# Patient Record
Sex: Male | Born: 1955
Health system: Southern US, Community
[De-identification: ages and names within clinical notes are randomized; demographics above are authoritative.]

## PROBLEM LIST (undated history)

## (undated) DIAGNOSIS — R351 Nocturia: Secondary | ICD-10-CM

## (undated) DIAGNOSIS — I10 Essential (primary) hypertension: Secondary | ICD-10-CM

## (undated) DIAGNOSIS — J45909 Unspecified asthma, uncomplicated: Secondary | ICD-10-CM

## (undated) DIAGNOSIS — N529 Male erectile dysfunction, unspecified: Secondary | ICD-10-CM

## (undated) DIAGNOSIS — E119 Type 2 diabetes mellitus without complications: Secondary | ICD-10-CM

## (undated) DIAGNOSIS — C61 Malignant neoplasm of prostate: Secondary | ICD-10-CM

## (undated) DIAGNOSIS — J302 Other seasonal allergic rhinitis: Secondary | ICD-10-CM

## (undated) HISTORY — PX: NO PAST SURGERIES: SHX2092

## (undated) HISTORY — PX: PROSTATE BIOPSY: SHX241

---

## 2009-07-20 ENCOUNTER — Emergency Department: Payer: Self-pay | Admitting: Emergency Medicine

## 2009-09-21 ENCOUNTER — Ambulatory Visit: Payer: Self-pay | Admitting: Otolaryngology

## 2018-03-01 ENCOUNTER — Other Ambulatory Visit: Payer: Self-pay

## 2018-03-01 ENCOUNTER — Emergency Department
Admission: EM | Admit: 2018-03-01 | Discharge: 2018-03-01 | Discharge status: Against Medical Advice | Payer: Self-pay | Attending: Emergency Medicine | Admitting: Emergency Medicine

## 2018-03-01 ENCOUNTER — Encounter: Payer: Self-pay | Admitting: Emergency Medicine

## 2018-03-01 DIAGNOSIS — Z5321 Procedure and treatment not carried out due to patient leaving prior to being seen by health care provider: Secondary | ICD-10-CM | POA: Insufficient documentation

## 2018-03-01 DIAGNOSIS — K0889 Other specified disorders of teeth and supporting structures: Secondary | ICD-10-CM | POA: Insufficient documentation

## 2018-03-01 HISTORY — DX: Type 2 diabetes mellitus without complications: E11.9

## 2018-03-01 HISTORY — DX: Unspecified asthma, uncomplicated: J45.909

## 2018-03-01 NOTE — ED Triage Notes (Signed)
Pt c/o L sided dental pain that radiates up L side of his face. Pt states pain started at 0400 this morning.

## 2018-12-20 ENCOUNTER — Encounter: Payer: Self-pay | Admitting: *Deleted

## 2018-12-20 NOTE — Progress Notes (Signed)
GU Location of Tumor / Histology: prostatic adenocarcinoma  If Prostate Cancer, Gleason Score is (3 + 4) and PSA is (4.77). Prostate volume: 35    Biopsies of prostate (if applicable) revealed:   Past/Anticipated interventions by urology, if any: prostate biospy, referral for consideration of radioactive seeds vs surgery  Past/Anticipated interventions by medical oncology, if any: no  Weight changes, if any: denies  Bowel/Bladder complaints, if any: IPSS 4. SHIM 21 with siladenafil 50. Reports rare dysuria. Denies hematuria. Reports urinary urgency with rare leakage. Denies any bowel complaints.   Nausea/Vomiting, if any: denies  Pain issues, if any:  no  SAFETY ISSUES:  Prior radiation? no  Pacemaker/ICD? no  Possible current pregnancy? no, male patient  Is the patient on methotrexate? no  Current Complaints / other details:  63 year old male. Married with 1 daughter and 3 sons. Stopped smoking 09/1998 after 12 years of smoking 1/2 ppd. Works as a Clinical cytogeneticist for [employer].

## 2018-12-21 ENCOUNTER — Other Ambulatory Visit: Payer: Self-pay

## 2018-12-21 ENCOUNTER — Encounter: Payer: Self-pay | Admitting: Radiation Oncology

## 2018-12-21 ENCOUNTER — Ambulatory Visit
Admission: RE | Admit: 2018-12-21 | Discharge: 2018-12-21 | Disposition: A | Discharge status: Home / Self Care | Payer: 59 | Source: Ambulatory Visit | Attending: Radiation Oncology | Admitting: Radiation Oncology

## 2018-12-21 VITALS — Ht 75.0 in | Wt 250.0 lb

## 2018-12-21 DIAGNOSIS — C61 Malignant neoplasm of prostate: Secondary | ICD-10-CM

## 2018-12-21 HISTORY — DX: Malignant neoplasm of prostate: C61

## 2018-12-21 NOTE — Progress Notes (Signed)
See progress note under physician encounter. 

## 2018-12-21 NOTE — Progress Notes (Signed)
Radiation Oncology         [phone] ________________________________  Initial outpatient Consultation - Conducted via Telephone due to current COVID-19 concerns for limiting patient exposure  Name: Jesse Cardenas MRN: [id number]  Date: 12/21/2018  DOB: 1955-11-26  CC:No primary care provider on file.  Alexis Frock, MD   REFERRING PHYSICIAN: Alexis Frock, MD  DIAGNOSIS: 63 y.o. gentleman with Stage T1c adenocarcinoma of the prostate with Gleason score of 3+4, and PSA of 4.77.    ICD-10-CM   1. Malignant neoplasm of prostate ([place])  C61     HISTORY OF PRESENT ILLNESS: Jesse Cardenas is a 63 y.o. male with a diagnosis of prostate cancer. He was noted to have a persistent elevated PSA of 4.77 in 02/2018, by his primary care physician, Dr. Brunetta Genera, as well as complaints of some bothersome LUTS and increasing erectile dysfunction.  His prior PSA was 4.8 in 09/2017.  Accordingly, he was referred for evaluation in urology by Dr. Tresa Moore on 09/28/2018,  digital rectal examination was performed at that time revealing no abnormalities though it was noted to be a difficult exam.  The patient proceeded to transrectal ultrasound with 12 biopsies of the prostate on 11/09/2018.  The prostate volume measured 35 cc.  Out of 12 core biopsies, 3 were positive.  The maximum Gleason score was 3+4, and this was seen in the left mid and right mid (small focus). Additionally, Gleason 3+3 was seen in the left apex.  The patient reviewed the biopsy results with his urologist and he has kindly been referred today for discussion of potential radiation treatment options.    PREVIOUS RADIATION THERAPY: No  PAST MEDICAL HISTORY:  Past Medical History:  Diagnosis Date  . Asthma   . DM (diabetes mellitus) ([place])   . Prostate cancer ([place])       PAST SURGICAL HISTORY: Past Surgical History:  Procedure Laterality Date  . PROSTATE BIOPSY      FAMILY HISTORY:  Family History  Problem Relation Age of Onset   . Prostate cancer Neg Hx   . Breast cancer Neg Hx   . Colon cancer Neg Hx   . Pancreatic cancer Neg Hx     SOCIAL HISTORY:  Social History   Socioeconomic History  . Marital status: Married    Spouse name: Not on file  . Number of children: Not on file  . Years of education: Not on file  . Highest education level: Not on file  Occupational History    Comment: lineman  Social Needs  . Financial resource strain: Not on file  . Food insecurity    Worry: Not on file    Inability: Not on file  . Transportation needs    Medical: Not on file    Non-medical: Not on file  Tobacco Use  . Smoking status: Former Smoker    Packs/day: 0.50    Years: 10.00    Pack years: 5.00    Types: Cigarettes    Quit date: 01/14/1988    Years since quitting: 30.9  . Smokeless tobacco: Never Used  Substance and Sexual Activity  . Alcohol use: Not Currently  . Drug use: Never  . Sexual activity: Yes  Lifestyle  . Physical activity    Days per week: Not on file    Minutes per session: Not on file  . Stress: Not on file  Relationships  . Social Herbalist on phone: Not on file    Gets together:  Not on file    Attends religious service: Not on file    Active member of club or organization: Not on file    Attends meetings of clubs or organizations: Not on file    Relationship status: Not on file  . Intimate partner violence    Fear of current or ex partner: Not on file    Emotionally abused: Not on file    Physically abused: Not on file    Forced sexual activity: Not on file  Other Topics Concern  . Not on file  Social History Narrative  . Not on file    ALLERGIES: Erythromycin  MEDICATIONS:  Current Outpatient Medications  Medication Sig Dispense Refill  . albuterol (VENTOLIN HFA) 108 (90 Base) MCG/ACT inhaler Inhale 2 puffs into the lungs every 4 (four) hours as needed.    . Continuous Blood Gluc Receiver (FREESTYLE LIBRE 14 DAY READER) DEVI See admin instructions.    .  Continuous Blood Gluc Sensor (FREESTYLE LIBRE 14 DAY SENSOR) MISC See admin instructions.    . fluticasone (FLONASE) 50 MCG/ACT nasal spray Place 2 sprays into both nostrils daily.    Marland Kitchen glyBURIDE-metformin (GLUCOVANCE) 2.5-500 MG tablet Take by mouth.    Marland Kitchen ibuprofen (ADVIL) 600 MG tablet Take 600 mg by mouth every 6 (six) hours as needed.    Marland Kitchen lisinopril (ZESTRIL) 5 MG tablet Take by mouth.    . pravastatin (PRAVACHOL) 40 MG tablet Take 40 mg by mouth daily.     No current facility-administered medications for this encounter.     REVIEW OF SYSTEMS:  On review of systems, the patient reports that he is doing well overall. He denies any chest pain, shortness of breath, cough, fevers, chills, night sweats, unintended weight changes. He denies any bowel disturbances, and denies abdominal pain, nausea or vomiting. He denies any new musculoskeletal or joint aches or pains. His IPSS was 4, indicating mild urinary symptoms. He reports rare dysuria and urinary urgency with rare leakage. His SHIM was 21, indicating he improved erectile dysfunction with siladenafil. A complete review of systems is obtained and is otherwise negative.    PHYSICAL EXAM:  Wt Readings from Last 3 Encounters:  12/21/18 250 lb (113.4 kg)  03/01/18 257 lb (116.6 kg)   Temp Readings from Last 3 Encounters:  03/01/18 98.9 F (37.2 C) (Oral)   BP Readings from Last 3 Encounters:  03/01/18 (!) 182/99   Pulse Readings from Last 3 Encounters:  03/01/18 75   Pain Assessment Pain Score: 0-No pain/10  Physical exam not performed in light of telephone encounter.   KPS = 100  100 - Normal; no complaints; no evidence of disease. 90   - Able to carry on normal activity; minor signs or symptoms of disease. 80   - Normal activity with effort; some signs or symptoms of disease. 56   - Cares for self; unable to carry on normal activity or to do active work. 60   - Requires occasional assistance, but is able to care for most of  his personal needs. 50   - Requires considerable assistance and frequent medical care. 5   - Disabled; requires special care and assistance. 43   - Severely disabled; hospital admission is indicated although death not imminent. 57   - Very sick; hospital admission necessary; active supportive treatment necessary. 10   - Moribund; fatal processes progressing rapidly. 0     - Dead  Karnofsky DA, Abelmann WH, Craver LS and Burchenal [facility] [phone])  The use of the nitrogen mustards in the palliative treatment of carcinoma: with particular reference to bronchogenic carcinoma Cancer 1 634-56  LABORATORY DATA:  No results found for: WBC, HGB, HCT, MCV, PLT No results found for: NA, K, CL, CO2 No results found for: ALT, AST, GGT, ALKPHOS, BILITOT   RADIOGRAPHY: No results found.    IMPRESSION/PLAN: This visit was conducted via Telephone to spare the patient unnecessary potential exposure in the healthcare setting during the current COVID-19 pandemic. 1. 63 y.o. gentleman with Stage T1c adenocarcinoma of the prostate with Gleason Score of 3+4, and PSA of 4.77. We discussed the patient's workup and outlined the nature of prostate cancer in this setting. The patient's T stage, Gleason's score, and PSA put him into the favorable intermediate risk group. Accordingly, he is eligible for a variety of potential treatment options including brachytherapy, 5.5 weeks of external radiation, or prostatectomy. We discussed the available radiation techniques, and focused on the details and logistics and delivery. We discussed and outlined the risks, benefits, short and long-term effects associated with radiotherapy and compared and contrasted these with prostatectomy. We discussed the role of SpaceOAR in reducing the rectal toxicity associated with radiotherapy. He was encouraged to ask questions that were answered to his stated satisfaction. He appears to have a good understanding of his disease and our treatment  recommendations which are of curative intent.  At the end of the conversation, the patient remains undecided regarding his final treatment preference and would like to take some additional time to consider his options. He was initially considering prostatectomy but is now leaning towards brachytherapy. We will share our discussion with Dr. Tresa Moore and look forward to following along in the care of this very nice gentleman.  We would be more than happy to continue to participate in his care should he elect to proceed with radiotherapy.   Given current concerns for patient exposure during the COVID-19 pandemic, this encounter was conducted via telephone. The patient was notified in advance and was offered a MyChart meeting to allow for face to face communication but unfortunately reported that he did not have the appropriate resources/technology to support such a visit and instead preferred to proceed with telephone consult. The patient has given verbal consent for this type of encounter. The time spent during this encounter was 70 minutes. The attendants for this meeting include Tyler Pita MD, Ashlyn Bruning PA-C, Katie Daubenspeck- scribe, patient Jesse Cardenas and his wife. During the encounter, Tyler Pita MD, Ashlyn Bruning PA-C, and scribe, Wilburn Mylar were located at [place].  Patient Jesse Cardenas and his wife were located at home.    Nicholos Johns, PA-C    Tyler Pita, MD  [facility] Direct Dial: [phone]  Fax: [phone] [email or web address]  Skype  [email or web address]  This document serves as a record of services personally performed by Tyler Pita, MD and Freeman Caldron, PA-C. It was created on their behalf by Wilburn Mylar, a trained medical scribe. The creation of this record is based on the scribe's personal observations and the provider's statements to them. This document has been checked  and approved by the attending provider.

## 2018-12-22 ENCOUNTER — Telehealth: Payer: Self-pay | Admitting: Radiation Oncology

## 2018-12-22 NOTE — Telephone Encounter (Signed)
Received message from Jesse Cardenas that patient is requesting a return call. Patient states, "I am questioning getting those seeds, will I be able to have sex?" Verbalized the following: You may sleep in the same bed as your partner (provided she is not pregnant or under the age of 58). Sexual intercourse, using a condom, may be resumed 2 weeks after the implant. Your semen may be discolored, dark brown or black. This is normal and is the result of bleeding that may have occurred during the implant. After 3-4 weeks it will not be necessary to use a condom. Patient verbalized understanding of all reviewed and expressed appreciation for the return call.

## 2019-01-03 ENCOUNTER — Telehealth: Payer: Self-pay | Admitting: Medical Oncology

## 2019-01-03 NOTE — Telephone Encounter (Signed)
Spoke with patient to introduce myself as the prostate nurse navigator and discuss my role. He consulted with Dr. Tammi Klippel, on 12/8 but was undecided on treatment decision. He states the consult went well and has decided  to move forward with brachytherapy. He has an appointment this afternoon with Dr. Tresa Moore, to discuss his decision and asked he needs to keep appointment. I encouraged him to call Dr. Zettie Pho office and discuss. He states he is happy that I called and glad to know I  will be following him.Marland Kitchen He states he was referred to another urology practice and was never contacted with an appointment. He followed up but requested to be referred to [facility]. I discussed  the scheduling process for brachytherapy and he is aware it may be January-February before he is scheduled. He is aware Enid Derry will contact him to schedule appointments. I gave him my contact information and asked him to call me with questions or concerns. He voiced understanding.

## 2019-01-04 ENCOUNTER — Telehealth: Payer: Self-pay | Admitting: *Deleted

## 2019-01-04 NOTE — Telephone Encounter (Signed)
CALLED PATIENT TO ASK QUESTIONS, SPOKE WITH PATIENT 

## 2019-02-10 DIAGNOSIS — C61 Malignant neoplasm of prostate: Secondary | ICD-10-CM | POA: Insufficient documentation

## 2019-02-16 ENCOUNTER — Telehealth: Payer: Self-pay | Admitting: *Deleted

## 2019-02-16 NOTE — Telephone Encounter (Signed)
CALLED PATIENT TO REMIND OF PRE-SEED APPTS. FOR 02-17-19, SPOKE WITH PATIENT AND HE IS AWARE OF THESE APPTS.

## 2019-02-17 ENCOUNTER — Encounter (HOSPITAL_COMMUNITY)
Admission: RE | Admit: 2019-02-17 | Discharge: 2019-02-17 | Disposition: A | Discharge status: Home / Self Care | Payer: 59 | Source: Ambulatory Visit | Attending: Urology | Admitting: Urology

## 2019-02-17 ENCOUNTER — Other Ambulatory Visit: Payer: Self-pay

## 2019-02-17 ENCOUNTER — Other Ambulatory Visit: Payer: Self-pay | Admitting: Urology

## 2019-02-17 ENCOUNTER — Ambulatory Visit (HOSPITAL_COMMUNITY)
Admission: RE | Admit: 2019-02-17 | Discharge: 2019-02-17 | Disposition: A | Discharge status: Home / Self Care | Payer: 59 | Source: Ambulatory Visit | Attending: Urology | Admitting: Urology

## 2019-02-17 ENCOUNTER — Ambulatory Visit
Admission: RE | Admit: 2019-02-17 | Discharge: 2019-02-17 | Disposition: A | Discharge status: Home / Self Care | Payer: 59 | Source: Ambulatory Visit | Attending: Radiation Oncology | Admitting: Radiation Oncology

## 2019-02-17 ENCOUNTER — Ambulatory Visit
Admission: RE | Admit: 2019-02-17 | Discharge: 2019-02-17 | Disposition: A | Discharge status: Home / Self Care | Payer: 59 | Source: Ambulatory Visit | Attending: Urology | Admitting: Urology

## 2019-02-17 ENCOUNTER — Encounter: Payer: Self-pay | Admitting: Medical Oncology

## 2019-02-17 DIAGNOSIS — C61 Malignant neoplasm of prostate: Secondary | ICD-10-CM | POA: Diagnosis present

## 2019-02-17 DIAGNOSIS — Z01818 Encounter for other preprocedural examination: Secondary | ICD-10-CM | POA: Diagnosis present

## 2019-02-17 NOTE — Progress Notes (Signed)
  Radiation Oncology         [phone] ________________________________  Name: LAVAUGHN DUVERNAY MRN: [id number]  Date: 02/17/2019  DOB: 26-Jul-1955  SIMULATION AND TREATMENT PLANNING NOTE PUBIC ARCH STUDY  [id number], Pcp Not In  Alexis Frock, MD  DIAGNOSIS: 63 y.o. gentleman with Stage T1c adenocarcinoma of the prostate with Gleason score of 3+4, and PSA of 4.77.     ICD-10-CM   1. Malignant neoplasm of prostate ([place])  C61     COMPLEX SIMULATION:  The patient presented today for evaluation for possible prostate seed implant. He was brought to the radiation planning suite and placed supine on the CT couch. A 3-dimensional image study set was obtained in upload to the planning computer. There, on each axial slice, I contoured the prostate gland. Then, using three-dimensional radiation planning tools I reconstructed the prostate in view of the structures from the transperineal needle pathway to assess for possible pubic arch interference. In doing so, I did not appreciate any pubic arch interference. Also, the patient's prostate volume was estimated based on the drawn structure. The volume was 35 cc.  Given the pubic arch appearance and prostate volume, patient remains a good candidate to proceed with prostate seed implant. Today, he freely provided informed written consent to proceed.    PLAN: The patient will undergo prostate seed implant.   ________________________________  Sheral Apley. Tammi Klippel, M.D.

## 2019-02-18 ENCOUNTER — Other Ambulatory Visit: Payer: Self-pay | Admitting: Urology

## 2019-03-11 ENCOUNTER — Encounter (HOSPITAL_BASED_OUTPATIENT_CLINIC_OR_DEPARTMENT_OTHER): Payer: Self-pay | Admitting: Urology

## 2019-03-11 ENCOUNTER — Other Ambulatory Visit: Payer: Self-pay

## 2019-03-11 NOTE — Progress Notes (Signed)
Spoke w/ via phone for pre-op interview--- PT Lab needs dos----none               Lab results------ getting CBC,CMP,PT/PTT done 03-16-2019 @ 1300;  Current ekg and cxr in epic COVID test ------ 03-15-2019 @ 1530 @[facility]  Arrive at ------- 0730 NPO after ------ [place] Medications to take morning of surgery ----- NONE Diabetic medication ----- do not take glucovance morning of surgery Patient Special Instructions ----- will do one fleet enema morning of surgery Pre-Op special Istructions ----- n/a Patient verbalized understanding of instructions that were given at this phone interview. Patient denies shortness of breath, chest pain, fever, cough a this phone interview.   Anesthesia :  PCP:  Dr Jerilynn Mages. [place] Cardiologist :  no Chest x-ray : 02-17-2019 epic EKG :  02-17-2019  epic Echo :  No Stress test:  Per pt had one done approx. 2005 @[facility] , told ok Cardiac Cath :  no Sleep Study/ CPAP : NO Fasting Blood Sugar :  120-180    / Checks Blood Sugar -- times a day:  3-4 times daily Blood Thinner/ Instructions /Last Dose: NO ASA / Instructions/ Last Dose :  NO

## 2019-03-14 ENCOUNTER — Telehealth: Payer: Self-pay | Admitting: *Deleted

## 2019-03-14 NOTE — Telephone Encounter (Signed)
CALLED PATIENT TO REMIND OF COVID TESTING FOR 03-15-19 AND HIS LABS ON 03-16-19 @ 1 PM @ [facility] AD[facility]ITTING, SPOKE WITH PATIENT AND HE IS AWARE OF THESE APPTS.

## 2019-03-15 ENCOUNTER — Other Ambulatory Visit
Admission: RE | Admit: 2019-03-15 | Discharge: 2019-03-15 | Disposition: A | Discharge status: Home / Self Care | Payer: 59 | Source: Ambulatory Visit | Attending: Urology | Admitting: Urology

## 2019-03-15 DIAGNOSIS — Z20822 Contact with and (suspected) exposure to covid-19: Secondary | ICD-10-CM | POA: Insufficient documentation

## 2019-03-15 DIAGNOSIS — Z01812 Encounter for preprocedural laboratory examination: Secondary | ICD-10-CM | POA: Diagnosis not present

## 2019-03-15 LAB — SARS CORONAVIRUS 2 (TAT 6-24 HRS): SARS Coronavirus 2: NEGATIVE

## 2019-03-16 ENCOUNTER — Encounter (HOSPITAL_COMMUNITY)
Admission: RE | Admit: 2019-03-16 | Discharge: 2019-03-16 | Disposition: A | Discharge status: Home / Self Care | Payer: 59 | Source: Ambulatory Visit | Attending: Urology | Admitting: Urology

## 2019-03-16 ENCOUNTER — Other Ambulatory Visit: Payer: Self-pay

## 2019-03-16 DIAGNOSIS — Z01812 Encounter for preprocedural laboratory examination: Secondary | ICD-10-CM | POA: Diagnosis not present

## 2019-03-16 LAB — CBC
HCT: 47.9 % (ref 39.0–52.0)
Hemoglobin: 15.1 g/dL (ref 13.0–17.0)
MCH: 26.5 pg (ref 26.0–34.0)
MCHC: 31.5 g/dL (ref 30.0–36.0)
MCV: 84.2 fL (ref 80.0–100.0)
Platelets: 209 10*3/uL (ref 150–400)
RBC: 5.69 MIL/uL (ref 4.22–5.81)
RDW: 13.2 % (ref 11.5–15.5)
WBC: 5.1 10*3/uL (ref 4.0–10.5)
nRBC: 0 % (ref 0.0–0.2)

## 2019-03-16 LAB — COMPREHENSIVE METABOLIC PANEL
ALT: 28 U/L (ref 0–44)
AST: 26 U/L (ref 15–41)
Albumin: 4.1 g/dL (ref 3.5–5.0)
Alkaline Phosphatase: 43 U/L (ref 38–126)
Anion gap: 10 (ref 5–15)
BUN: 12 mg/dL (ref 8–23)
CO2: 26 mmol/L (ref 22–32)
Calcium: 9.6 mg/dL (ref 8.9–10.3)
Chloride: 100 mmol/L (ref 98–111)
Creatinine, Ser: 0.95 mg/dL (ref 0.61–1.24)
GFR calc Af Amer: >60 mL/min (ref >60)
GFR calc non Af Amer: >60 mL/min (ref >60)
Glucose, Bld: 205 mg/dL — ABNORMAL HIGH (ref 70–99)
Potassium: 4.9 mmol/L (ref 3.5–5.1)
Sodium: 136 mmol/L (ref 135–145)
Total Bilirubin: 1.1 mg/dL (ref 0.3–1.2)
Total Protein: 7.7 g/dL (ref 6.5–8.1)

## 2019-03-16 LAB — APTT: aPTT: 32 seconds (ref 24–36)

## 2019-03-16 LAB — PROTIME-INR
INR: 1 (ref 0.8–1.2)
Prothrombin Time: 12.8 seconds (ref 11.4–15.2)

## 2019-03-17 ENCOUNTER — Telehealth: Payer: Self-pay | Admitting: *Deleted

## 2019-03-17 NOTE — Telephone Encounter (Signed)
CALLED PATIENT TO REMIND OF PROCEDURE FOR 03-18-19, SPOKE WITH PATIENT AND HE IS AWARE OF THIS PROCEDURE

## 2019-03-18 ENCOUNTER — Ambulatory Visit (HOSPITAL_COMMUNITY): Payer: 59

## 2019-03-18 ENCOUNTER — Encounter (HOSPITAL_BASED_OUTPATIENT_CLINIC_OR_DEPARTMENT_OTHER): Payer: Self-pay | Admitting: Urology

## 2019-03-18 ENCOUNTER — Ambulatory Visit (HOSPITAL_BASED_OUTPATIENT_CLINIC_OR_DEPARTMENT_OTHER)
Admission: RE | Admit: 2019-03-18 | Discharge: 2019-03-18 | Disposition: A | Discharge status: Home / Self Care | Payer: 59 | Source: Other Acute Inpatient Hospital | Attending: Urology | Admitting: Urology

## 2019-03-18 ENCOUNTER — Ambulatory Visit (HOSPITAL_BASED_OUTPATIENT_CLINIC_OR_DEPARTMENT_OTHER): Payer: 59 | Admitting: Anesthesiology

## 2019-03-18 ENCOUNTER — Other Ambulatory Visit: Payer: Self-pay

## 2019-03-18 ENCOUNTER — Encounter (HOSPITAL_BASED_OUTPATIENT_CLINIC_OR_DEPARTMENT_OTHER)
Admission: RE | Disposition: A | Discharge status: Home / Self Care | Payer: Self-pay | Source: Other Acute Inpatient Hospital | Attending: Urology

## 2019-03-18 ENCOUNTER — Ambulatory Visit (HOSPITAL_BASED_OUTPATIENT_CLINIC_OR_DEPARTMENT_OTHER): Payer: 59 | Admitting: Physician Assistant

## 2019-03-18 DIAGNOSIS — Z87891 Personal history of nicotine dependence: Secondary | ICD-10-CM | POA: Insufficient documentation

## 2019-03-18 DIAGNOSIS — E119 Type 2 diabetes mellitus without complications: Secondary | ICD-10-CM | POA: Insufficient documentation

## 2019-03-18 DIAGNOSIS — I10 Essential (primary) hypertension: Secondary | ICD-10-CM | POA: Diagnosis not present

## 2019-03-18 DIAGNOSIS — C61 Malignant neoplasm of prostate: Secondary | ICD-10-CM | POA: Diagnosis present

## 2019-03-18 DIAGNOSIS — Z79899 Other long term (current) drug therapy: Secondary | ICD-10-CM | POA: Diagnosis not present

## 2019-03-18 DIAGNOSIS — Z6830 Body mass index (BMI) 30.0-30.9, adult: Secondary | ICD-10-CM | POA: Diagnosis not present

## 2019-03-18 DIAGNOSIS — Z01818 Encounter for other preprocedural examination: Secondary | ICD-10-CM

## 2019-03-18 DIAGNOSIS — N529 Male erectile dysfunction, unspecified: Secondary | ICD-10-CM | POA: Diagnosis not present

## 2019-03-18 DIAGNOSIS — E669 Obesity, unspecified: Secondary | ICD-10-CM | POA: Insufficient documentation

## 2019-03-18 DIAGNOSIS — J45909 Unspecified asthma, uncomplicated: Secondary | ICD-10-CM | POA: Diagnosis not present

## 2019-03-18 HISTORY — PX: RADIOACTIVE SEED IMPLANT: SHX5150

## 2019-03-18 HISTORY — DX: Unspecified asthma, uncomplicated: J45.909

## 2019-03-18 HISTORY — DX: Male erectile dysfunction, unspecified: N52.9

## 2019-03-18 HISTORY — DX: Nocturia: R35.1

## 2019-03-18 HISTORY — PX: SPACE OAR INSTILLATION: SHX6769

## 2019-03-18 HISTORY — DX: Type 2 diabetes mellitus without complications: E11.9

## 2019-03-18 HISTORY — DX: Other seasonal allergic rhinitis: J30.2

## 2019-03-18 HISTORY — DX: Essential (primary) hypertension: I10

## 2019-03-18 LAB — GLUCOSE, CAPILLARY
Glucose-Capillary: 230 mg/dL — ABNORMAL HIGH (ref 70–99)
Glucose-Capillary: 185 mg/dL — ABNORMAL HIGH (ref 70–99)

## 2019-03-18 SURGERY — INSERTION, RADIATION SOURCE, PROSTATE
Anesthesia: General | Site: Prostate

## 2019-03-18 MED ORDER — FENTANYL CITRATE (PF) 100 MCG/2ML IJ SOLN
INTRAMUSCULAR | Status: AC
  Filled 2019-03-18: qty 2

## 2019-03-18 MED ORDER — TAMSULOSIN HCL 0.4 MG PO CAPS: 0.4000 mg | ORAL_CAPSULE | Freq: Every day | ORAL | 11 refills | Status: AC | PRN

## 2019-03-18 MED ORDER — GENTAMICIN SULFATE 40 MG/ML IJ SOLN
5.0000 mg/kg | Freq: Once | INTRAVENOUS | Status: AC
  Administered 2019-03-18: 570 mg via INTRAVENOUS
  Filled 2019-03-18 (×2): qty 14.25

## 2019-03-18 MED ORDER — ONDANSETRON HCL 4 MG/2ML IJ SOLN
INTRAMUSCULAR | Status: AC
  Filled 2019-03-18: qty 2

## 2019-03-18 MED ORDER — INSULIN REGULAR HUMAN 100 UNIT/ML IJ SOLN
5.0000 [IU] | Freq: Once | INTRAMUSCULAR | Status: AC
  Administered 2019-03-18: 5 [IU] via SUBCUTANEOUS
  Filled 2019-03-18: qty 3

## 2019-03-18 MED ORDER — FENTANYL CITRATE (PF) 100 MCG/2ML IJ SOLN
INTRAMUSCULAR | Status: DC | PRN
  Administered 2019-03-18 (×2): 50 ug via INTRAVENOUS

## 2019-03-18 MED ORDER — ONDANSETRON HCL 4 MG/2ML IJ SOLN
INTRAMUSCULAR | Status: DC | PRN
  Administered 2019-03-18: 4 mg via INTRAVENOUS

## 2019-03-18 MED ORDER — LIDOCAINE 2% (20 MG/ML) 5 ML SYRINGE
INTRAMUSCULAR | Status: AC
  Filled 2019-03-18: qty 5

## 2019-03-18 MED ORDER — MIDAZOLAM HCL 5 MG/5ML IJ SOLN
INTRAMUSCULAR | Status: DC | PRN
  Administered 2019-03-18: 2 mg via INTRAVENOUS

## 2019-03-18 MED ORDER — FENTANYL CITRATE (PF) 100 MCG/2ML IJ SOLN
25.0000 ug | INTRAMUSCULAR | Status: DC | PRN
  Administered 2019-03-18: 50 ug via INTRAVENOUS
  Filled 2019-03-18: qty 1

## 2019-03-18 MED ORDER — LIDOCAINE 2% (20 MG/ML) 5 ML SYRINGE
INTRAMUSCULAR | Status: DC | PRN
  Administered 2019-03-18: 80 mg via INTRAVENOUS

## 2019-03-18 MED ORDER — SODIUM CHLORIDE 0.9 % IV SOLN
INTRAVENOUS | Status: AC | PRN
  Administered 2019-03-18: 1000 mL

## 2019-03-18 MED ORDER — PROPOFOL 10 MG/ML IV BOLUS
INTRAVENOUS | Status: DC | PRN
  Administered 2019-03-18: 200 mg via INTRAVENOUS

## 2019-03-18 MED ORDER — DEXAMETHASONE SODIUM PHOSPHATE 10 MG/ML IJ SOLN
INTRAMUSCULAR | Status: DC | PRN
  Administered 2019-03-18: 10 mg via INTRAVENOUS

## 2019-03-18 MED ORDER — INSULIN ASPART 100 UNIT/ML ~~LOC~~ SOLN
SUBCUTANEOUS | Status: AC
  Filled 2019-03-18: qty 1

## 2019-03-18 MED ORDER — TRAMADOL HCL 50 MG PO TABS: 50.0000 mg | ORAL_TABLET | Freq: Four times a day (QID) | ORAL | 0 refills | Status: AC | PRN

## 2019-03-18 MED ORDER — FLEET ENEMA 7-19 GM/118ML RE ENEM
1.0000 | ENEMA | Freq: Once | RECTAL | Status: DC
  Filled 2019-03-18: qty 1

## 2019-03-18 MED ORDER — MIDAZOLAM HCL 2 MG/2ML IJ SOLN
INTRAMUSCULAR | Status: AC
  Filled 2019-03-18: qty 2

## 2019-03-18 MED ORDER — LACTATED RINGERS IV SOLN
INTRAVENOUS | Status: DC
  Filled 2019-03-18: qty 1000

## 2019-03-18 MED ORDER — PROMETHAZINE HCL 25 MG/ML IJ SOLN
6.2500 mg | INTRAMUSCULAR | Status: DC | PRN
  Filled 2019-03-18: qty 1

## 2019-03-18 MED ORDER — SODIUM CHLORIDE (PF) 0.9 % IJ SOLN
INTRAMUSCULAR | Status: DC | PRN
  Administered 2019-03-18: 10 mL

## 2019-03-18 MED ORDER — DEXAMETHASONE SODIUM PHOSPHATE 10 MG/ML IJ SOLN
INTRAMUSCULAR | Status: AC
  Filled 2019-03-18: qty 1

## 2019-03-18 MED ORDER — OXYCODONE HCL 5 MG PO TABS
5.0000 mg | ORAL_TABLET | Freq: Once | ORAL | Status: DC | PRN
  Filled 2019-03-18: qty 1

## 2019-03-18 MED ORDER — PROPOFOL 10 MG/ML IV BOLUS
INTRAVENOUS | Status: AC
  Filled 2019-03-18: qty 20

## 2019-03-18 MED ORDER — OXYCODONE HCL 5 MG/5ML PO SOLN
5.0000 mg | Freq: Once | ORAL | Status: DC | PRN
  Filled 2019-03-18: qty 5

## 2019-03-18 MED ORDER — IOHEXOL 300 MG/ML  SOLN
INTRAMUSCULAR | Status: DC | PRN
  Administered 2019-03-18: 7 mL

## 2019-03-18 SURGICAL SUPPLY — 37 items
BAG URINE DRAIN 2000ML AR STRL (UROLOGICAL SUPPLIES) ×4 IMPLANT
BLADE CLIPPER SENSICLIP SURGIC (BLADE) ×4 IMPLANT
CATH FOLEY 2WAY SLVR  5CC 16FR (CATHETERS) ×2
CATH FOLEY 2WAY SLVR 5CC 16FR (CATHETERS) ×2 IMPLANT
CATH ROBINSON RED A/P 20FR (CATHETERS) ×4 IMPLANT
CLOTH BEACON ORANGE TIMEOUT ST (SAFETY) ×4 IMPLANT
CNTNR URN SCR LID CUP LEK RST (MISCELLANEOUS) ×4 IMPLANT
CONT SPEC 4OZ STRL OR WHT (MISCELLANEOUS) ×4
COVER BACK TABLE 60X90IN (DRAPES) ×4 IMPLANT
COVER MAYO STAND STRL (DRAPES) ×4 IMPLANT
DRSG TEGADERM 4X4.75 (GAUZE/BANDAGES/DRESSINGS) ×4 IMPLANT
DRSG TEGADERM 8X12 (GAUZE/BANDAGES/DRESSINGS) ×4 IMPLANT
GAUZE SPONGE 4X4 12PLY STRL (GAUZE/BANDAGES/DRESSINGS) ×4 IMPLANT
GLOVE BIO SURGEON STRL SZ 6.5 (GLOVE) ×3 IMPLANT
GLOVE BIO SURGEON STRL SZ7.5 (GLOVE) ×8 IMPLANT
GLOVE BIO SURGEONS STRL SZ 6.5 (GLOVE) ×1
GLOVE BIOGEL PI IND STRL 6.5 (GLOVE) ×2 IMPLANT
GLOVE BIOGEL PI IND STRL 7.0 (GLOVE) ×2 IMPLANT
GLOVE BIOGEL PI INDICATOR 6.5 (GLOVE) ×2
GLOVE BIOGEL PI INDICATOR 7.0 (GLOVE) ×2
GLOVE SURG ORTHO 8.5 STRL (GLOVE) ×16 IMPLANT
GOWN STRL REUS W/ TWL LRG LVL3 (GOWN DISPOSABLE) ×2 IMPLANT
GOWN STRL REUS W/TWL LRG LVL3 (GOWN DISPOSABLE) ×6 IMPLANT
GOWN STRL REUS W/TWL XL LVL3 (GOWN DISPOSABLE) ×4 IMPLANT
I-Seed AgX100 ×316 IMPLANT
IMPL SPACEOAR SYSTEM 10ML (Spacer) ×2 IMPLANT
IMPLANT SPACEOAR SYSTEM 10ML (Spacer) ×4 IMPLANT
IV NS 1000ML (IV SOLUTION) ×2
IV NS 1000ML BAXH (IV SOLUTION) ×2 IMPLANT
KIT TURNOVER CYSTO (KITS) ×4 IMPLANT
MARKER SKIN DUAL TIP RULER LAB (MISCELLANEOUS) ×4 IMPLANT
PACK CYSTO (CUSTOM PROCEDURE TRAY) ×4 IMPLANT
SURGILUBE 2OZ TUBE FLIPTOP (MISCELLANEOUS) ×4 IMPLANT
SYR 10ML LL (SYRINGE) ×4 IMPLANT
TOWEL OR 17X26 10 PK STRL BLUE (TOWEL DISPOSABLE) ×4 IMPLANT
UNDERPAD 30X30 (UNDERPADS AND DIAPERS) ×8 IMPLANT
WATER STERILE IRR 500ML POUR (IV SOLUTION) ×4 IMPLANT

## 2019-03-18 NOTE — Anesthesia Postprocedure Evaluation (Signed)
Anesthesia Post Note  Patient: TEVYN TEO  Procedure(s) Performed: RADIOACTIVE SEED IMPLANT/BRACHYTHERAPY IMPLANT (N/A Prostate) SPACE OAR INSTILLATION (N/A Perineum)     Patient location during evaluation: PACU Anesthesia Type: General Level of consciousness: awake and alert Pain management: pain level controlled Vital Signs Assessment: post-procedure vital signs reviewed and stable Respiratory status: spontaneous breathing, nonlabored ventilation and respiratory function stable Cardiovascular status: blood pressure returned to baseline and stable Postop Assessment: no apparent nausea or vomiting Anesthetic complications: no    Last Vitals:  Vitals:   03/18/19 1145 03/18/19 1200  BP: 131/86 129/82  Pulse: 65 70  Resp: 14 (!) 9  Temp:    SpO2: 95% 97%                  Audry Pili

## 2019-03-18 NOTE — Anesthesia Preprocedure Evaluation (Addendum)
Anesthesia Evaluation  Patient identified by MRN, date of birth, ID band Patient awake    Reviewed: Allergy & Precautions, NPO status , Patient's Chart, lab work & pertinent test results  History of Anesthesia Complications Negative for: history of anesthetic complications  Airway Mallampati: II  TM Distance: >3 FB Neck ROM: Full    Dental  (+) Dental Advisory Given, Teeth Intact   Pulmonary asthma , former smoker,    Pulmonary exam normal        Cardiovascular hypertension, Pt. on medications Normal cardiovascular exam     Neuro/Psych negative neurological ROS  negative psych ROS   GI/Hepatic negative GI ROS, Neg liver ROS,   Endo/Other  diabetes, Type 2, Oral Hypoglycemic Agents Obesity   Renal/GU negative Renal ROS    Prostate cancer     Musculoskeletal negative musculoskeletal ROS (+)   Abdominal   Peds  Hematology negative hematology ROS (+)   Anesthesia Other Findings Covid neg 03/15/19   Reproductive/Obstetrics                            Anesthesia Physical Anesthesia Plan  ASA: III  Anesthesia Plan: General   Post-op Pain Management:    Induction: Intravenous  PONV Risk Score and Plan: 2 and Treatment may vary due to age or medical condition, Ondansetron, Dexamethasone and Midazolam  Airway Management Planned: LMA  Additional Equipment: None  Intra-op Plan:   Post-operative Plan: Extubation in OR  Informed Consent: I have reviewed the patients History and Physical, chart, labs and discussed the procedure including the risks, benefits and alternatives for the proposed anesthesia with the patient or authorized representative who has indicated his/her understanding and acceptance.     Dental advisory given  Plan Discussed with: CRNA and Anesthesiologist  Anesthesia Plan Comments:        Anesthesia Quick Evaluation

## 2019-03-18 NOTE — Op Note (Signed)
NAME: Jesse Cardenas, RODINO MEDICAL RECORD [id number] ACCOUNT [id number] DATE OF BIRTH:1955-04-11 FACILITY: [facility] LOCATION: [facility] PHYSICIAN:Franci Oshana Tresa Moore, MD  OPERATIVE REPORT  DATE OF PROCEDURE:  03/18/2019  SURGEON:  Alexis Frock, MD  PREOPERATIVE DIAGNOSIS:  Moderate risk prostate cancer.  PROCEDURES:   1.  Prostate brachytherapy seed implantation with ultrasound guidance. 2.  Cystoscopy.  ESTIMATED BLOOD LOSS:  Nil.  COMPLICATIONS:  None.  SPECIMENS:  None.  FINDINGS:   1.  Excellent placement of brachytherapy seeds as per plan. 2.  Unremarkable urinary bladder.  No evidence of intraluminal brachytherapy seeds.  RADIATION PLAN:  145 Gy delivered across 79 seeds on 24 catheters.  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    His prostate fortunately is quite small.  Options were discussed  for management including surveillance protocols versus ablative therapy for surgical extirpation and he adamantly wished to proceed with ablative therapy with prostate brachytherapy in conjunction with radiation oncology, Dr. Ledon Snare.  He seemed to  be a suitable candidate.  He presents for this today.  Informed consent was obtained and placed in the medical record.  DESCRIPTION OF PROCEDURE:  The patient being identified, the procedure being cystoscopy and brachytherapy seed implantation was confirmed as well as SpaceOAR placement, timeout was performed, intravenous antibiotics were administered.  General anesthesia  induced.  The patient was placed into a medium lithotomy position.  A large Tegaderm was placed across his scrotum and penis after Foley catheter was placed per urethra with 10 mL of contrast in the balloon.  Transrectal ultrasound was performed.   Volumetric analysis was performed, allowing radiation planning as per separate  radiation oncology note.  After radiation plan developed, 24 catheters were placed as per the plan, proceeding from an anterior to posterior direction, delivering 79 active  seeds.  Following this, the stepper apparatus was taken off of anterior tension.  The grid was removed.  Attention was directed to [facility] placement.  Also via transperineal approach, the included finder needle was placed corresponding to mid gland, mid  prostate, into anterior perirectal fat as was verified by a 2 mL flush of saline.  Next, 10 mL of SpaceOAR gel matrix were delivered across 10 seconds as per manufacturer's guidelines, which resulted in excellent posterior displacement of the anterior  rectal wall.  Next, the large Tegaderm dressing was taken down.  The penis was prepped with iodine and cystourethroscopy was performed using a 16-French flexible scope.  Cystourethroscopy revealed unremarkable anterior and posterior urethra.  Inspection of bladder revealed no diverticula, calcifications, papillary lesions or brachytherapy seeds.  Retroflexion revealed no additional findings.  Bladder was partially empty  per cystoscope.  Procedure was then terminated.  The patient tolerated the procedure well.  No immediate perioperative complications.  The patient was taken to postanesthesia care unit in stable condition.  Plan for discharge home.  VN/NUANCE  D:03/18/2019 T:03/18/2019 JOB:[id number]

## 2019-03-18 NOTE — Transfer of Care (Signed)
Immediate Anesthesia Transfer of Care Note  Patient: Jesse Cardenas  Procedure(s) Performed: Procedure(s): RADIOACTIVE SEED IMPLANT/BRACHYTHERAPY IMPLANT (N/A) SPACE OAR INSTILLATION (N/A)  Patient Location: PACU  Anesthesia Type:General  Level of Consciousness: Alert, Awake, Oriented  Airway & Oxygen Therapy: Patient Spontanous Breathing  Post-op Assessment: Report given to RN  Post vital signs: Reviewed and stable  Last Vitals:  Vitals:   03/18/19 0749 03/18/19 1115  BP: (!) 150/72 (P) 106/70  Pulse: 81 65  Resp: 18 (P) 16  Temp: 36.6 C (!) 36.3 C  SpO2: A999333 [id number]    Complications: No apparent anesthesia complications

## 2019-03-18 NOTE — H&P (Signed)
Jesse Cardenas is an 64 y.o. male.    Chief Complaint: Pre-OP Prostate Brachytherapy Seed Implantation  HPI:    1 - Moderate Risk Prostate Caner - 3 cores up to 20% Grade 2 cancer by BX 10/2018 on eval PSA 4.77. LMM, RMM, LMA positive. TRUS 35 mL, no median.    PMH sig for obesity, DM2 (A1c 8-10). No prior surgeries. No ischemic CV disease / blood thinners. He is Clinical cytogeneticist for [employer]. His PCP is Lorelee Market MD with [facility]   Today " Jesse Cardenas" is seen for prostate brachytherapy seed implantation in combined Urol / Rad-Onc procedure. NO interval fevers. C19 screen negative. Most recent UA without infectious parameters.     Past Medical History:  Diagnosis Date  . ED (erectile dysfunction)   . Hypertension    followed by pcp---  (03-11-2019 per pt has had stress test approx. 2005, done at [facility], told ok )  . Mild asthma    followed by pcp  . Nocturia   . Prostate cancer [facility]) urologist-- dr Tresa Moore;  oncologist--- dr Tammi Klippel   dx 11-09-2018,  Stage T1c,  [place] 3+4  . Seasonal allergic rhinitis   . Type 2 diabetes mellitus ([place])    followed by pcp----  (03-11-2019  checks blood sugar 3-4 times daily,  fasting sugar-- 120-180)    Past Surgical History:  Procedure Laterality Date  . NO PAST SURGERIES    . PROSTATE BIOPSY      Family History  Problem Relation Age of Onset  . Prostate cancer Neg Hx   . Breast cancer Neg Hx   . Colon cancer Neg Hx   . Pancreatic cancer Neg Hx    Social History:  reports that he quit smoking about 31 years ago. His smoking use included cigarettes. He has a 5.00 pack-year smoking history. He has never used smokeless tobacco. He reports previous alcohol use. He reports that he does not use drugs.  Allergies:  Allergies  Allergen Reactions  . Erythromycin Hives    No medications prior to admission.    Results for orders placed or performed during the hospital encounter of 03/16/19 (from the past 48 hour(s))  APTT     Status: None    Collection Time: 03/16/19  1:40 PM  Result Value Ref Range   aPTT 32 24 - 36 seconds    Comment: Performed at [facility], [place] [address] [place], [place] [address]  CBC     Status: None   Collection Time: 03/16/19  1:40 PM  Result Value Ref Range   WBC 5.1 4.0 - 10.5 K/uL   RBC 5.69 4.22 - 5.81 MIL/uL   Hemoglobin 15.1 13.0 - 17.0 g/dL   HCT 47.9 39.0 - 52.0 %   MCV 84.2 80.0 - 100.0 fL   MCH 26.5 26.0 - 34.0 pg   MCHC 31.5 30.0 - 36.0 g/dL   RDW 13.2 11.5 - 15.5 %   Platelets 209 150 - 400 K/uL   nRBC 0.0 0.0 - 0.2 %    Comment: Performed at [facility], [place] [address] [place], [place] [address]  Comprehensive metabolic panel     Status: Abnormal   Collection Time: 03/16/19  1:40 PM  Result Value Ref Range   Sodium 136 135 - 145 mmol/L   Potassium 4.9 3.5 - 5.1 mmol/L   Chloride 100 98 - 111 mmol/L   CO2 26 22 - 32 mmol/L   Glucose, Bld 205 (H)  70 - 99 mg/dL    Comment: Glucose reference range applies only to samples taken after fasting for at least 8 hours.   BUN 12 8 - 23 mg/dL   Creatinine, Ser 0.95 0.61 - 1.24 mg/dL   Calcium 9.6 8.9 - 10.3 mg/dL   Total Protein 7.7 6.5 - 8.1 g/dL   Albumin 4.1 3.5 - 5.0 g/dL   AST 26 15 - 41 U/L   ALT 28 0 - 44 U/L   Alkaline Phosphatase 43 38 - 126 U/L   Total Bilirubin 1.1 0.3 - 1.2 mg/dL   GFR calc non Af Amer >60 >60 mL/min   GFR calc Af Amer >60 >60 mL/min   Anion gap 10 5 - 15    Comment: Performed at [facility], [place] [address], [place], [place] [address]  Protime-INR     Status: None   Collection Time: 03/16/19  1:40 PM  Result Value Ref Range   Prothrombin Time 12.8 11.4 - 15.2 seconds   INR 1.0 0.8 - 1.2    Comment: (NOTE) INR goal varies based on device and disease states. Performed at [facility], [place] [address], [place], [place] [address]    No results found.  Review of Systems  Constitutional: Negative for chills and fever.   All other systems reviewed and are negative.   Height 6\' 3"  (1.905 m), weight 113.4 kg. Physical Exam  Constitutional: He appears well-developed.  Very stocky.  HENT:  Head: Normocephalic.  Cardiovascular: Normal rate.  GI: Soft.  Genitourinary:    Genitourinary Comments: No cVAT   Musculoskeletal:        General: Normal range of motion.     Cervical back: Normal range of motion.  Neurological: He is alert.  Skin: Skin is warm.  Psychiatric: He has a normal mood and affect.     Assessment/Plan  Proceed as planned with prostate brachytherapy seed implantattion. Risks, benefits, alternaitves, expected peri-op course discussed previously and reiterated today.    Alexis Frock, MD 03/18/2019, 6:43 AM

## 2019-03-18 NOTE — Anesthesia Procedure Notes (Signed)
Procedure Name: LMA Insertion Date/Time: 03/18/2019 9:42 AM Performed by: Gerald Leitz, CRNA Pre-anesthesia Checklist: Patient identified, Patient being monitored, Timeout performed, Emergency Drugs available and Suction available Patient Re-evaluated:Patient Re-evaluated prior to induction Oxygen Delivery Method: Circle system utilized Preoxygenation: Pre-oxygenation with 100% oxygen Induction Type: IV induction Ventilation: Mask ventilation without difficulty LMA: LMA inserted LMA Size: 5.0 Tube type: Oral Number of attempts: 1 Placement Confirmation: positive ETCO2 and breath sounds checked- equal and bilateral Tube secured with: Tape Dental Injury: Teeth and Oropharynx as per pre-operative assessment

## 2019-03-18 NOTE — Discharge Instructions (Signed)
1 - You may have urinary urgency (bladder spasms) and bloody urine on / off for up to 2 weeks. This is normal.  2 - Call MD or go to ER for fever >102, severe pain / nausea / vomiting not relieved by medications, or acute change in medical status   Post Anesthesia Home Care Instructions  Activity: Get plenty of rest for the remainder of the day. A responsible adult should stay with you for 24 hours following the procedure.  For the next 24 hours, DO NOT: -Drive a car -Paediatric nurse -Drink alcoholic beverages -Take any medication unless instructed by your physician -Make any legal decisions or sign important papers.  Meals: Start with liquid foods such as gelatin or soup. Progress to regular foods as tolerated. Avoid greasy, spicy, heavy foods. If nausea and/or vomiting occur, drink only clear liquids until the nausea and/or vomiting subsides. Call your physician if vomiting continues.  Special Instructions/Symptoms: Your throat may feel dry or sore from the anesthesia or the breathing tube placed in your throat during surgery. If this causes discomfort, gargle with warm salt water. The discomfort should disappear within 24 hours.  If you had a scopolamine patch placed behind your ear for the management of post- operative nausea and/or vomiting:  1. The medication in the patch is effective for 72 hours, after which it should be removed.  Wrap patch in a tissue and discard in the trash. Wash hands thoroughly with soap and water. 2. You may remove the patch earlier than 72 hours if you experience unpleasant side effects which may include dry mouth, dizziness or visual disturbances. 3. Avoid touching the patch. Wash your hands with soap and water after contact with the patch.    Brachytherapy for Prostate Cancer, Care After  This sheet gives you information about how to care for yourself after your procedure. Your health care provider may also give you more specific instructions. If  you have problems or questions, contact your health care provider. What can I expect after the procedure? After the procedure, it is common to have:  Trouble passing urine.  Blood in the urine or semen.  Constipation.  Frequent feeling of an urgent need to urinate.  Bruising, swelling, and tenderness of the area behind the scrotum (perineum).  Bloating and gas.  Fatigue.  Burning or pain in the rectum.  Problems getting or keeping an erection (erectile dysfunction).  Nausea. Follow these instructions at home: Managing pain, stiffness, and swelling  If directed, apply ice to the affected area: ? Put ice in a plastic bag. ? Place a towel between your skin and the bag. ? Leave the ice on for 20 minutes, 2-3 times a day.  Try not to sit directly on the area behind the scrotum. A soft cushion can help with discomfort. Activity  Do not drive for 24 hours if you were given a medicine to help you relax (sedative).  Do not drive or use heavy machinery while taking prescription pain medicine.  Rest as told by your health care provider.  Most people can return to normal activities a few days or weeks after the procedure. Ask your health care provider what activities are safe for you. Eating and drinking  Drink enough fluid to keep your urine clear or pale yellow.  Eat a healthy, balanced diet. This includes lean proteins, whole grains, and plenty of fruits and vegetables. General instructions  Take over-the-counter and prescription medicines only as told by your health care provider.  Keep all follow-up visits as told by your health care provider. This is important. You may still need additional treatment.  Do not take baths, swim, or use a hot tub until your health care provider approves. Shower and wash the area behind the scrotum gently.  Do not have sex for one week after the treatment, or until your health care provider approves.  If you have permanent, low-dose  brachytherapy implants: ? Limit close contact with children and pregnant women for 2 months or as told by your health care provider. This is important because of the radiation that is still active in the prostate. ? You may set off radioactive sensors, such as airport screenings. Ask your health care provider for a document that explains your treatment. ? You may be instructed to use a condom during sex for the first 2 months after low-dose brachytherapy. Contact a health care provider if:  You have a fever or chills.  You do not have a bowel movement for 3-4 days after the procedure.  You have diarrhea for 3-4 days after the procedure.  You develop any new symptoms, such as problems with urinating or erectile dysfunction.  You have abdomen (abdominal) pain.  You have more blood in your urine. Get help right away if:  You cannot urinate.  There is excessive bleeding from your rectum.  You have unusual drainage coming from your rectum.  You have severe pain in the treated area that does not go away with pain medicine.  You have severe nausea or vomiting. Summary  If you have permanent, low-dose brachytherapy implants, limit close contact with children and pregnant women for 2 months or as told by your health care provider. This is important because of the radiation that is still active in the prostate.  Talk with your health care provider about your risk of brachytherapy side effects, such as erectile dysfunction or urinary problems. Your health care provider will be able to recommend possible treatment options.  Keep all follow-up visits as told by your health care provider. This is important. You may need additional treatment. This information is not intended to replace advice given to you by your health care provider. Make sure you discuss any questions you have with your health care provider. Document Revised: 12/12/2016 Document Reviewed: 02/01/2016 Elsevier Patient Education   [place].

## 2019-03-18 NOTE — Brief Op Note (Signed)
03/18/2019  11:15 AM  PATIENT:  Elson Areas  64 y.o. male  PRE-OPERATIVE DIAGNOSIS:  PROSTATE CANCER  POST-OPERATIVE DIAGNOSIS:  PROSTATE CANCER  PROCEDURE:  Procedure(s): RADIOACTIVE SEED IMPLANT/BRACHYTHERAPY IMPLANT (N/A) SPACE OAR INSTILLATION (N/A)  SURGEON:  Surgeon(s) and Role:    * Alexis Frock, MD - Primary    * Tyler Pita, MD  PHYSICIAN ASSISTANT:   ASSISTANTS: none   ANESTHESIA:   general  EBL:  minimal   BLOOD ADMINISTERED:none  DRAINS: none   LOCAL MEDICATIONS USED:  NONE  SPECIMEN:  No Specimen  DISPOSITION OF SPECIMEN:  N/A  COUNTS:  YES  TOURNIQUET:  * No tourniquets in log *  DICTATION: .Other Dictation: Dictation Number [id number]  PLAN OF CARE: Discharge to home after PACU  PATIENT DISPOSITION:  PACU - hemodynamically stable.   Delay start of Pharmacological VTE agent (>24hrs) due to surgical blood loss or risk of bleeding: yes

## 2019-03-18 NOTE — Addendum Note (Signed)
Addendum  created 03/18/19 1333 by Gerald Leitz, CRNA   Charge Capture section accepted

## 2019-03-19 NOTE — Progress Notes (Signed)
  Radiation Oncology         [phone] ________________________________  Name: Jesse Cardenas MRN: [id number]  Date: 03/19/2019  DOB: June 30, 1955       Prostate Seed Implant  [id number], [place], MD  No ref. provider found  DIAGNOSIS: 64 y.o. gentleman with Stage T1c adenocarcinoma of the prostate with Gleason score of 3+4, and PSA of 4.77.    ICD-10-CM   1. Preop testing  Z01.818 DG Chest 2 View    DG Chest 2 View    PROCEDURE: Insertion of radioactive I-125 seeds into the prostate gland.  RADIATION DOSE: 145 Gy, definitive therapy.  TECHNIQUE: Jesse Cardenas was brought to the operating room with the urologist. He was placed in the dorsolithotomy position. He was catheterized and a rectal tube was inserted. The perineum was shaved, prepped and draped. The ultrasound probe was then introduced into the rectum to see the prostate gland.  TREATMENT DEVICE: A needle grid was attached to the ultrasound probe stand and anchor needles were placed.  3D PLANNING: The prostate was imaged in 3D using a sagittal sweep of the prostate probe. These images were transferred to the planning computer. There, the prostate, urethra and rectum were defined on each axial reconstructed image. Then, the software created an optimized 3D plan and a few seed positions were adjusted. The quality of the plan was reviewed using [facility] information for the target and the following two organs at risk:  Urethra and Rectum.  Then the accepted plan was printed and handed off to the radiation therapist.  Under my supervision, the custom loading of the seeds and spacers was carried out and loaded into sealed vicryl sleeves.  These pre-loaded needles were then placed into the needle holder.Marland Kitchen  PROSTATE VOLUME STUDY:  Using transrectal ultrasound the volume of the prostate was verified to be 47 cc.  SPECIAL TREATMENT PROCEDURE/SUPERVISION AND HANDLING: The pre-loaded needles were then delivered under sagittal guidance. A total  of 24 needles were used to deposit 79 seeds in the prostate gland. The individual seed activity was 0.446 mCi.  SpaceOAR:  Yes  COMPLEX SIMULATION: At the end of the procedure, an anterior radiograph of the pelvis was obtained to document seed positioning and count. Cystoscopy was performed to check the urethra and bladder.  MICRODOSIMETRY: At the end of the procedure, the patient was emitting 0.095 mR/hr at 1 meter. Accordingly, he was considered safe for hospital discharge.  PLAN: The patient will return to the radiation oncology clinic for post implant CT dosimetry in three weeks.   ________________________________  Sheral Apley Tammi Klippel, M.D.

## 2019-04-05 ENCOUNTER — Other Ambulatory Visit: Payer: Self-pay

## 2019-04-05 ENCOUNTER — Telehealth: Payer: Self-pay | Admitting: *Deleted

## 2019-04-05 ENCOUNTER — Telehealth: Payer: Self-pay | Admitting: Radiation Oncology

## 2019-04-05 NOTE — Telephone Encounter (Signed)
I called the patient and he was willing to come in at 3pm for his post seed CT, followed by visit with Ashlyn and MRI to follow.

## 2019-04-05 NOTE — Telephone Encounter (Signed)
Called patient to remind of post seed appts. and MRI for 04-06-19, spoke with patient and he is aware of these appts.

## 2019-04-06 ENCOUNTER — Ambulatory Visit
Admission: RE | Admit: 2019-04-06 | Discharge: 2019-04-06 | Disposition: A | Discharge status: Home / Self Care | Payer: 59 | Source: Ambulatory Visit | Attending: Radiation Oncology | Admitting: Radiation Oncology

## 2019-04-06 ENCOUNTER — Ambulatory Visit
Admission: RE | Admit: 2019-04-06 | Discharge: 2019-04-06 | Disposition: A | Discharge status: Home / Self Care | Payer: 59 | Source: Ambulatory Visit | Attending: Urology | Admitting: Urology

## 2019-04-06 ENCOUNTER — Encounter (HOSPITAL_COMMUNITY): Payer: Self-pay

## 2019-04-06 ENCOUNTER — Ambulatory Visit (HOSPITAL_COMMUNITY)
Admission: RE | Admit: 2019-04-06 | Discharge: 2019-04-06 | Disposition: A | Discharge status: Home / Self Care | Payer: 59 | Source: Ambulatory Visit | Attending: Urology | Admitting: Urology

## 2019-04-06 ENCOUNTER — Telehealth: Payer: Self-pay | Admitting: *Deleted

## 2019-04-06 ENCOUNTER — Other Ambulatory Visit: Payer: Self-pay

## 2019-04-06 VITALS — BP 125/83 | HR 88 | Temp 97.8°F | Resp 18 | Ht 75.0 in | Wt 242.4 lb

## 2019-04-06 DIAGNOSIS — C61 Malignant neoplasm of prostate: Secondary | ICD-10-CM | POA: Diagnosis present

## 2019-04-06 DIAGNOSIS — Z510 Encounter for antineoplastic radiation therapy: Secondary | ICD-10-CM | POA: Diagnosis not present

## 2019-04-06 NOTE — Progress Notes (Signed)
Radiation Oncology         [phone] ________________________________  Name: Jesse Cardenas MRN: [id number]  Date: 04/06/2019  DOB: September 16, 1955  Post-Seed Follow-Up Visit Note  CC: Jesse Market, MD  Jesse Market, MD  Diagnosis:   64 y.o. gentleman with Stage T1c adenocarcinoma of the prostate with Gleason score of 3+4, and PSA of 4.77.    ICD-10-CM   1. Malignant neoplasm of prostate ([place])  C61     Interval Since Last Radiation:  2.5 weeks 03/18/19:  Insertion of radioactive I-125 seeds into the prostate gland; 145 Gy, definitive therapy with placement of SpaceOAR gel.  Narrative:  The patient returns today for routine follow-up.  He is complaining of increased urinary frequency and urinary hesitation symptoms. He filled out a questionnaire regarding urinary function today providing and overall IPSS score of 21 characterizing his symptoms as severe with increased frequency, urgency, intermittency, hesitancy and weak stream but feels that these are gradually improving.  He is taking Flomax daily as prescribed.  His pre-implant score was 4. He denies any abdominal pain or bowel symptoms.  ALLERGIES:  is allergic to erythromycin.  Meds: Current Outpatient Medications  Medication Sig Dispense Refill  . Albuterol Sulfate (PROAIR HFA IN) Inhale into the lungs as needed.    . Cholecalciferol (VITAMIN D3) 125 MCG (5000 UT) CAPS Take 1 capsule by mouth daily.    . Continuous Blood Gluc Receiver (FREESTYLE LIBRE 14 DAY READER) DEVI See admin instructions.    . Continuous Blood Gluc Sensor (FREESTYLE LIBRE 14 DAY SENSOR) MISC See admin instructions.    . fluticasone (FLONASE) 50 MCG/ACT nasal spray Place 2 sprays into both nostrils daily as needed.     . glyBURIDE-metformin (GLUCOVANCE) 2.5-500 MG tablet Take 1 tablet by mouth 2 (two) times daily with a meal.     . lisinopril (ZESTRIL) 5 MG tablet Take 5 mg by mouth daily.     . pravastatin (PRAVACHOL) 40 MG tablet Take 40 mg by  mouth daily.     . tamsulosin (FLOMAX) 0.4 MG CAPS capsule Take 1 capsule (0.4 mg total) by mouth daily as needed. For urinary urgency / frequency after prostate radiation. 30 capsule 11  . traMADol (ULTRAM) 50 MG tablet Take 1-2 tablets (50-100 mg total) by mouth every 6 (six) hours as needed for moderate pain or severe pain. Post-operatively 15 tablet 0  . tadalafil (CIALIS) 5 MG tablet Take 5 mg by mouth daily as needed for erectile dysfunction.     No current facility-administered medications for this encounter.    Physical Findings: In general this is a well appearing African American male in no acute distress. He's alert and oriented x4 and appropriate throughout the examination. Cardiopulmonary assessment is negative for acute distress and he exhibits normal effort.   Lab Findings: Lab Results  Component Value Date   WBC 5.1 03/16/2019   HGB 15.1 03/16/2019   HCT 47.9 03/16/2019   MCV 84.2 03/16/2019   PLT 209 03/16/2019    Radiographic Findings:  Patient underwent CT imaging in our clinic for post implant dosimetry. The CT will be reviewed by Dr. Tammi Cardenas to confirm there is an adequate distribution of radioactive seeds throughout the prostate gland and ensure that there are no seeds in or near the rectum. His scheduled for prostate MRI today at 5pm and those images will be fused with his CT images for further evaluation. We suspect the final radiation plan and dosimetry will show appropriate coverage of the  prostate gland. He understands that we will call and inform him of any unexpected findings on further review of his imaging and dosimetry.  Impression/Plan: 64 y.o. gentleman with Stage T1c adenocarcinoma of the prostate with Gleason score of 3+4, and PSA of 4.77. The patient is recovering from the effects of radiation. His urinary symptoms should gradually improve over the next 4-6 months. We talked about this today. He is encouraged by his improvement already and is otherwise  pleased with his outcome. We also talked about long-term follow-up for prostate cancer following seed implant. He understands that ongoing PSA determinations and digital rectal exams will help perform surveillance to rule out disease recurrence. He has a follow up appointment scheduled with Dr. Tresa Cardenas on 06/06/19. He understands what to expect with his PSA measures. Patient was also educated today about some of the long-term effects from radiation including a small risk for rectal bleeding and possibly erectile dysfunction. We talked about some of the general management approaches to these potential complications. However, I did encourage the patient to contact our office or return at any point if he has questions or concerns related to his previous radiation and prostate cancer.  Today, a comprehensive survivorship care plan and treatment summary was reviewed with the patient today detailing his prostate cancer diagnosis, treatment course, potential late/long-term effects of treatment, appropriate follow-up care with recommendations for the future, and patient education resources.  A copy of this summary, along with a letter will be sent to the patient's primary care provider via fax after today's visit.    2. Cancer screening:  Due to Jesse Cardenas history and his age, he should receive screening for skin cancers, colon cancer, and lung cancer.  The information and recommendations are listed on the patient's comprehensive care plan/treatment summary and were reviewed in detail with the patient.      3. Health maintenance and wellness promotion: Jesse Cardenas was encouraged to consume 5-7 servings of fruits and vegetables per day. He was provided a copy of the "Nutrition Rainbow" handout, as well as the handout "Take Control of Your Health and [place]" from the [place].  He was also encouraged to engage in moderate to vigorous exercise for 30 minutes per day most days of the week.  Information was provided regarding the [facility] fitness program, which is designed for cancer survivors to help them become more physically fit after cancer treatments. We discussed that a healthy BMI is 18.5-24.9 and that maintaining a healthy weight reduces risk of cancer recurrences.  He was instructed to limit his alcohol consumption and continue to abstain from tobacco use.  Lastly, he was encouraged to use sunscreen and wear protective clothing when in the sun.     4. Support services/counseling: It is not uncommon for this period of the patient's cancer care trajectory to be one of many emotions and stressors.  Mr. Rehkop was encouraged to take advantage of our many support services programs, support groups, and/or counseling in coping with his new life as a cancer survivor after completing anti-cancer treatment.  He was offered support today through active listening and expressive supportive counseling.  He was given information regarding our available services and encouraged to contact me with any questions or for help enrolling in any of our support group/programs.       Nicholos Johns, PA-C

## 2019-04-06 NOTE — Progress Notes (Signed)
Patient reports no hematuria dysuria hard to start urine flow generally has a strong urine stream also feels he empties his bladder after urinating most of the time. However starting yesterday morning his urine stream started to become occasionally weakened and started to stop and start while urinating. AUA completed in epic urology follow up scheduled not sure when his appointment is.

## 2019-04-06 NOTE — Telephone Encounter (Signed)
CALLED PATIENT TO INFORM TO ARRIVE FOR POST SEED APPTS. ON 04-06-19 @ 2:45 PM, SPOKE WITH PATIENT AND HE IS AWARE OF THIS

## 2019-04-06 NOTE — Progress Notes (Signed)
  Radiation Oncology         [phone] ________________________________  Name: Jesse Cardenas MRN: [id number]  Date: 04/06/2019  DOB: 12/04/1955  COMPLEX SIMULATION NOTE  NARRATIVE:  The patient was brought to the [facility] today following prostate seed implantation approximately one month ago.  Identity was confirmed.  All relevant records and images related to the planned course of therapy were reviewed.  Then, the patient was set-up supine.  CT images were obtained.  The CT images were loaded into the planning software.  Then the prostate and rectum were contoured.  Treatment planning then occurred.  The implanted iodine 125 seeds were identified by the physics staff for projection of radiation distribution  I have requested : 3D Simulation  I have requested a DVH of the following structures: Prostate and rectum.    ________________________________  Sheral Apley Tammi Klippel, M.D.

## 2019-04-07 ENCOUNTER — Telehealth: Payer: Self-pay | Admitting: *Deleted

## 2019-04-07 ENCOUNTER — Encounter: Payer: Self-pay | Admitting: Medical Oncology

## 2019-04-07 NOTE — Telephone Encounter (Signed)
CALLED PATIENT TO INFORM THAT MRI WILL NOT BE DONE , DUE TO MRI BEING  FULLY BOOKED, SPOKE WITH PATIENT AND HE VERIFIED UNDERSTANDING THIS

## 2019-04-19 ENCOUNTER — Ambulatory Visit (HOSPITAL_COMMUNITY): Admission: RE | Admit: 2019-04-19 | Payer: 59 | Source: Ambulatory Visit

## 2019-05-02 ENCOUNTER — Ambulatory Visit
Admission: RE | Admit: 2019-05-02 | Discharge: 2019-05-02 | Disposition: A | Discharge status: Home / Self Care | Payer: 59 | Source: Ambulatory Visit | Attending: Radiation Oncology | Admitting: Radiation Oncology

## 2019-05-02 ENCOUNTER — Encounter: Payer: Self-pay | Admitting: Radiation Oncology

## 2019-05-02 DIAGNOSIS — Z510 Encounter for antineoplastic radiation therapy: Secondary | ICD-10-CM | POA: Diagnosis not present

## 2019-05-02 DIAGNOSIS — C61 Malignant neoplasm of prostate: Secondary | ICD-10-CM | POA: Diagnosis present

## 2019-05-19 ENCOUNTER — Encounter: Payer: Self-pay | Admitting: Medical Oncology

## 2019-05-19 NOTE — Progress Notes (Signed)
Patient called stating he needs refill on his Flomax. He was taking one capsule daily before seed implant but it was increased to BID. He has run out early and needs a refill. I asked him to call Dr. 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 He was very appreciative for the return call and voiced understanding.

## 2019-06-14 NOTE — Progress Notes (Signed)
  Radiation Oncology         [phone] ________________________________  Name: PRANAV ANDERLE MRN: [id number]  Date: 05/02/2019  DOB: [birth year]  3D Planning Note   Prostate Brachytherapy Post-Implant Dosimetry  Diagnosis: 64 y.o. gentleman with Stage T1c adenocarcinoma of the prostate with Gleason score of 3+4, and PSA of 4.77  Narrative: On a previous date, Jesse Cardenas returned following prostate seed implantation for post implant planning. He underwent CT scan complex simulation to delineate the three-dimensional structures of the pelvis and demonstrate the radiation distribution.  Since that time, the seed localization, and complex isodose planning with dose volume histograms have now been completed.  Results:   Prostate Coverage - The dose of radiation delivered to the 90% or more of the prostate gland (D90) was 110.13% of the prescription dose. This exceeds our goal of greater than 90%. Rectal Sparing - The volume of rectal tissue receiving the prescription dose or higher was 0.0 cc. This falls under our thresholds tolerance of 1.0 cc.  Impression: The prostate seed implant appears to show adequate target coverage and appropriate rectal sparing.  Plan:  The patient will continue to follow with urology for ongoing PSA determinations. I would anticipate a high likelihood for local tumor control with minimal risk for rectal morbidity.  ________________________________  Sheral Apley Tammi Klippel, M.D.

## 2020-12-13 ENCOUNTER — Other Ambulatory Visit: Payer: Self-pay | Admitting: Urology

## 2021-01-02 ENCOUNTER — Encounter (HOSPITAL_BASED_OUTPATIENT_CLINIC_OR_DEPARTMENT_OTHER): Payer: Self-pay | Admitting: Urology

## 2021-01-02 ENCOUNTER — Other Ambulatory Visit: Payer: Self-pay

## 2021-01-02 NOTE — Progress Notes (Signed)
Spoke w/ via phone for pre-op interview---pt Lab needs dos----  I stat, ekg             Lab results------none COVID test -----patient states asymptomatic no test needed Arrive at -------1245 pm 01-09-2021 NPO after MN NO Solid Food.  Clear liquids from MN until---1145 am Med rec completed Medications to take morning of surgery -----tamsulosin, albuterol inhaler prn bring inhaler, flonase prn, pravastatin Diabetic medications: none day of surgery Patient instructed no nail polish to be worn day of surgery Patient instructed to bring photo id and insurance card day of surgery Patient aware to have Driver (ride ) / caregiver    for 24 hours after surgery  wife Thayer Headings Patient Special Instructions -----none Pre-Op special Istructions -----none Patient verbalized understanding of instructions that were given at this phone interview. Patient denies shortness of breath, chest pain, fever, cough at this phone interview.   Pt will not put on free stle libre until after 01-09-2021 surgery

## 2021-01-09 ENCOUNTER — Encounter (HOSPITAL_BASED_OUTPATIENT_CLINIC_OR_DEPARTMENT_OTHER): Payer: Self-pay | Admitting: Urology

## 2021-01-09 ENCOUNTER — Ambulatory Visit (HOSPITAL_BASED_OUTPATIENT_CLINIC_OR_DEPARTMENT_OTHER)
Admission: RE | Admit: 2021-01-09 | Discharge: 2021-01-09 | Disposition: A | Discharge status: Home / Self Care | Payer: 59 | Attending: Urology | Admitting: Urology

## 2021-01-09 ENCOUNTER — Ambulatory Visit (HOSPITAL_BASED_OUTPATIENT_CLINIC_OR_DEPARTMENT_OTHER): Payer: 59 | Admitting: Anesthesiology

## 2021-01-09 ENCOUNTER — Other Ambulatory Visit: Payer: Self-pay

## 2021-01-09 ENCOUNTER — Encounter (HOSPITAL_BASED_OUTPATIENT_CLINIC_OR_DEPARTMENT_OTHER)
Admission: RE | Disposition: A | Discharge status: Home / Self Care | Payer: Self-pay | Source: Home / Self Care | Attending: Urology

## 2021-01-09 DIAGNOSIS — E669 Obesity, unspecified: Secondary | ICD-10-CM | POA: Insufficient documentation

## 2021-01-09 DIAGNOSIS — N471 Phimosis: Secondary | ICD-10-CM | POA: Insufficient documentation

## 2021-01-09 DIAGNOSIS — Z79899 Other long term (current) drug therapy: Secondary | ICD-10-CM | POA: Insufficient documentation

## 2021-01-09 DIAGNOSIS — Z923 Personal history of irradiation: Secondary | ICD-10-CM | POA: Insufficient documentation

## 2021-01-09 DIAGNOSIS — Z8546 Personal history of malignant neoplasm of prostate: Secondary | ICD-10-CM | POA: Diagnosis not present

## 2021-01-09 DIAGNOSIS — Z6832 Body mass index (BMI) 32.0-32.9, adult: Secondary | ICD-10-CM | POA: Diagnosis not present

## 2021-01-09 DIAGNOSIS — E119 Type 2 diabetes mellitus without complications: Secondary | ICD-10-CM | POA: Diagnosis not present

## 2021-01-09 DIAGNOSIS — Z87891 Personal history of nicotine dependence: Secondary | ICD-10-CM | POA: Insufficient documentation

## 2021-01-09 DIAGNOSIS — I10 Essential (primary) hypertension: Secondary | ICD-10-CM | POA: Diagnosis not present

## 2021-01-09 DIAGNOSIS — Z7984 Long term (current) use of oral hypoglycemic drugs: Secondary | ICD-10-CM | POA: Diagnosis not present

## 2021-01-09 HISTORY — PX: CIRCUMCISION: SHX1350

## 2021-01-09 LAB — POCT I-STAT, CHEM 8
BUN: 9 mg/dL (ref 8–23)
Calcium, Ion: 1.23 mmol/L (ref 1.15–1.40)
Chloride: 99 mmol/L (ref 98–111)
Creatinine, Ser: 0.8 mg/dL (ref 0.61–1.24)
Glucose, Bld: 266 mg/dL — ABNORMAL HIGH (ref 70–99)
HCT: 48 % (ref 39.0–52.0)
Hemoglobin: 16.3 g/dL (ref 13.0–17.0)
Potassium: 4 mmol/L (ref 3.5–5.1)
Sodium: 137 mmol/L (ref 135–145)
TCO2: 25 mmol/L (ref 22–32)

## 2021-01-09 LAB — GLUCOSE, CAPILLARY: Glucose-Capillary: 219 mg/dL — ABNORMAL HIGH (ref 70–99)

## 2021-01-09 SURGERY — CIRCUMCISION, ADULT
Anesthesia: General | Site: Penis

## 2021-01-09 MED ORDER — ACETAMINOPHEN 500 MG PO TABS
1000.0000 mg | ORAL_TABLET | Freq: Once | ORAL | Status: AC
  Administered 2021-01-09: 14:00:00 1000 mg via ORAL

## 2021-01-09 MED ORDER — MIDAZOLAM HCL 2 MG/2ML IJ SOLN
INTRAMUSCULAR | Status: AC
  Filled 2021-01-09: qty 2

## 2021-01-09 MED ORDER — ACETAMINOPHEN 500 MG PO TABS
ORAL_TABLET | ORAL | Status: AC
  Filled 2021-01-09: qty 2

## 2021-01-09 MED ORDER — ONDANSETRON HCL 4 MG/2ML IJ SOLN
INTRAMUSCULAR | Status: DC | PRN
  Administered 2021-01-09: 4 mg via INTRAVENOUS

## 2021-01-09 MED ORDER — FENTANYL CITRATE (PF) 100 MCG/2ML IJ SOLN
INTRAMUSCULAR | Status: DC | PRN
  Administered 2021-01-09: 50 ug via INTRAVENOUS

## 2021-01-09 MED ORDER — PHENYLEPHRINE HCL (PRESSORS) 10 MG/ML IV SOLN
INTRAVENOUS | Status: DC | PRN
  Administered 2021-01-09: 80 ug via INTRAVENOUS
  Administered 2021-01-09: 120 ug via INTRAVENOUS
  Administered 2021-01-09: 80 ug via INTRAVENOUS

## 2021-01-09 MED ORDER — 0.9 % SODIUM CHLORIDE (POUR BTL) OPTIME
TOPICAL | Status: DC | PRN
  Administered 2021-01-09: 15:00:00 500 mL

## 2021-01-09 MED ORDER — PHENYLEPHRINE 40 MCG/ML (10ML) SYRINGE FOR IV PUSH (FOR BLOOD PRESSURE SUPPORT)
PREFILLED_SYRINGE | INTRAVENOUS | Status: AC
  Filled 2021-01-09: qty 10

## 2021-01-09 MED ORDER — MIDAZOLAM HCL 2 MG/2ML IJ SOLN
INTRAMUSCULAR | Status: DC | PRN
  Administered 2021-01-09: 2 mg via INTRAVENOUS

## 2021-01-09 MED ORDER — PROPOFOL 10 MG/ML IV BOLUS
INTRAVENOUS | Status: DC | PRN
  Administered 2021-01-09: 200 mg via INTRAVENOUS

## 2021-01-09 MED ORDER — LIDOCAINE HCL (CARDIAC) PF 100 MG/5ML IV SOSY
PREFILLED_SYRINGE | INTRAVENOUS | Status: DC | PRN
  Administered 2021-01-09: 100 mg via INTRAVENOUS

## 2021-01-09 MED ORDER — ACETAMINOPHEN 500 MG PO TABS: 1000.0000 mg | ORAL_TABLET | Freq: Once | ORAL | Status: DC

## 2021-01-09 MED ORDER — FENTANYL CITRATE (PF) 100 MCG/2ML IJ SOLN
INTRAMUSCULAR | Status: AC
  Filled 2021-01-09: qty 2

## 2021-01-09 MED ORDER — CLINDAMYCIN PHOSPHATE 600 MG/50ML IV SOLN
600.0000 mg | INTRAVENOUS | Status: AC
  Administered 2021-01-09: 15:00:00 600 mg via INTRAVENOUS

## 2021-01-09 MED ORDER — ONDANSETRON HCL 4 MG/2ML IJ SOLN
INTRAMUSCULAR | Status: AC
  Filled 2021-01-09: qty 2

## 2021-01-09 MED ORDER — LACTATED RINGERS IV SOLN: INTRAVENOUS | Status: DC

## 2021-01-09 MED ORDER — OXYCODONE-ACETAMINOPHEN 5-325 MG PO TABS: 1.0000 | ORAL_TABLET | ORAL | 0 refills | Status: AC | PRN

## 2021-01-09 MED ORDER — INSULIN REGULAR HUMAN 100 UNIT/ML IJ SOLN
6.0000 [IU] | Freq: Once | INTRAMUSCULAR | Status: AC
  Administered 2021-01-09: 14:00:00 6 [IU] via SUBCUTANEOUS
  Filled 2021-01-09: qty 3

## 2021-01-09 MED ORDER — INSULIN ASPART 100 UNIT/ML IJ SOLN
INTRAMUSCULAR | Status: AC
  Filled 2021-01-09: qty 1

## 2021-01-09 MED ORDER — EPHEDRINE 5 MG/ML INJ
INTRAVENOUS | Status: AC
  Filled 2021-01-09: qty 5

## 2021-01-09 MED ORDER — CLINDAMYCIN PHOSPHATE 600 MG/50ML IV SOLN
INTRAVENOUS | Status: AC
  Filled 2021-01-09: qty 50

## 2021-01-09 MED ORDER — SENNOSIDES-DOCUSATE SODIUM 8.6-50 MG PO TABS: 1.0000 | ORAL_TABLET | Freq: Two times a day (BID) | ORAL | 0 refills | Status: AC

## 2021-01-09 MED ORDER — LIDOCAINE HCL (PF) 1 % IJ SOLN
INTRAMUSCULAR | Status: DC | PRN
  Administered 2021-01-09: 30 mL

## 2021-01-09 MED ORDER — BUPIVACAINE HCL 0.25 % IJ SOLN
INTRAMUSCULAR | Status: DC | PRN
  Administered 2021-01-09: 10 mL

## 2021-01-09 MED ORDER — EPHEDRINE SULFATE 50 MG/ML IJ SOLN
INTRAMUSCULAR | Status: DC | PRN
  Administered 2021-01-09: 5 mg via INTRAVENOUS

## 2021-01-09 SURGICAL SUPPLY — 27 items
BLADE CLIPPER SENSICLIP SURGIC (BLADE) ×2 IMPLANT
BLADE SURG 15 STRL LF DISP TIS (BLADE) ×1 IMPLANT
BLADE SURG 15 STRL SS (BLADE) ×3
BNDG COHESIVE 2X5 TAN ST LF (GAUZE/BANDAGES/DRESSINGS) ×3 IMPLANT
BNDG CONFORM 2 STRL LF (GAUZE/BANDAGES/DRESSINGS) ×3 IMPLANT
COVER BACK TABLE 60X90IN (DRAPES) ×3 IMPLANT
COVER MAYO STAND STRL (DRAPES) ×3 IMPLANT
DRAPE LAPAROTOMY 100X72 PEDS (DRAPES) ×3 IMPLANT
ELECT REM PT RETURN 9FT ADLT (ELECTROSURGICAL) ×3
ELECTRODE REM PT RTRN 9FT ADLT (ELECTROSURGICAL) ×1 IMPLANT
GAUZE 4X4 16PLY ~~LOC~~+RFID DBL (SPONGE) ×3 IMPLANT
GAUZE XEROFORM 1X8 LF (GAUZE/BANDAGES/DRESSINGS) ×3 IMPLANT
GLOVE SURG ENC MOIS LTX SZ7.5 (GLOVE) ×3 IMPLANT
GLOVE SURG POLYISO LF SZ7 (GLOVE) ×2 IMPLANT
GLOVE SURG UNDER POLY LF SZ7 (GLOVE) ×2 IMPLANT
GOWN STRL REUS W/TWL LRG LVL3 (GOWN DISPOSABLE) ×5 IMPLANT
KIT TURNOVER CYSTO (KITS) ×3 IMPLANT
NDL HYPO 25X1 1.5 SAFETY (NEEDLE) ×1 IMPLANT
NEEDLE HYPO 25X1 1.5 SAFETY (NEEDLE) ×3 IMPLANT
NS IRRIG 500ML POUR BTL (IV SOLUTION) ×2 IMPLANT
PACK BASIN DAY SURGERY FS (CUSTOM PROCEDURE TRAY) ×3 IMPLANT
PENCIL SMOKE EVACUATOR (MISCELLANEOUS) ×3 IMPLANT
SUT VIC AB 3-0 SH 27 (SUTURE) ×6
SUT VIC AB 3-0 SH 27XBRD (SUTURE) IMPLANT
SYR CONTROL 10ML LL (SYRINGE) ×3 IMPLANT
TOWEL OR 17X26 10 PK STRL BLUE (TOWEL DISPOSABLE) ×3 IMPLANT
TRAY DSU PREP LF (CUSTOM PROCEDURE TRAY) ×3 IMPLANT

## 2021-01-09 NOTE — Anesthesia Preprocedure Evaluation (Signed)
Anesthesia Evaluation  Patient identified by MRN, date of birth, ID band Patient awake    Reviewed: Allergy & Precautions, NPO status , Patient's Chart, lab work & pertinent test results  History of Anesthesia Complications Negative for: history of anesthetic complications  Airway Mallampati: II  TM Distance: >3 FB Neck ROM: Full    Dental  (+) Dental Advisory Given, Teeth Intact   Pulmonary asthma , former smoker,    Pulmonary exam normal        Cardiovascular hypertension, Pt. on medications Normal cardiovascular exam     Neuro/Psych negative neurological ROS     GI/Hepatic Neg liver ROS, GERD  ,  Endo/Other  diabetes, Type 2, Oral Hypoglycemic Agents  Renal/GU negative Renal ROS   Prostate cancer    Musculoskeletal negative musculoskeletal ROS (+)   Abdominal   Peds  Hematology negative hematology ROS (+)   Anesthesia Other Findings   Reproductive/Obstetrics                            Anesthesia Physical Anesthesia Plan  ASA: 2  Anesthesia Plan: General   Post-op Pain Management: Tylenol PO (pre-op) and Toradol IV (intra-op)   Induction: Intravenous  PONV Risk Score and Plan: 2 and Ondansetron, Dexamethasone, Midazolam and Treatment may vary due to age or medical condition  Airway Management Planned: LMA  Additional Equipment: None  Intra-op Plan:   Post-operative Plan: Extubation in OR  Informed Consent: I have reviewed the patients History and Physical, chart, labs and discussed the procedure including the risks, benefits and alternatives for the proposed anesthesia with the patient or authorized representative who has indicated his/her understanding and acceptance.     Dental advisory given  Plan Discussed with:   Anesthesia Plan Comments:         Anesthesia Quick Evaluation

## 2021-01-09 NOTE — Anesthesia Procedure Notes (Signed)
Procedure Name: LMA Insertion Date/Time: 01/09/2021 2:44 PM Performed by: Georgeanne Nim, CRNA Pre-anesthesia Checklist: Patient identified, Emergency Drugs available, Suction available, Patient being monitored and Timeout performed Patient Re-evaluated:Patient Re-evaluated prior to induction Oxygen Delivery Method: Circle system utilized Preoxygenation: Pre-oxygenation with 100% oxygen Induction Type: IV induction Ventilation: Mask ventilation without difficulty LMA: LMA inserted LMA Size: 5.0 Number of attempts: 1 Placement Confirmation: positive ETCO2, breath sounds checked- equal and bilateral and CO2 detector Tube secured with: Tape Dental Injury: Teeth and Oropharynx as per pre-operative assessment

## 2021-01-09 NOTE — Transfer of Care (Signed)
Immediate Anesthesia Transfer of Care Note  Patient: Jesse Cardenas  Procedure(s) Performed: CIRCUMCISION ADULT/ PENILE BLOCK (Penis)  Patient Location: PACU  Anesthesia Type:General  Level of Consciousness: awake, alert , oriented and patient cooperative  Airway & Oxygen Therapy: Patient Spontanous Breathing  Post-op Assessment: Report given to RN and Post -op Vital signs reviewed and stable  Post vital signs: Reviewed and stable  Last Vitals:  Vitals Value Taken Time  BP 136/88 01/09/21 1534  Temp    Pulse 79 01/09/21 1536  Resp    SpO2 94 % 01/09/21 1536  Vitals shown include unvalidated device data.  Last Pain:  Vitals:   01/09/21 1325  TempSrc: Oral         Complications: No notable events documented.

## 2021-01-09 NOTE — Discharge Instructions (Addendum)
1 - Dressing can come off tomorrow morning. OK to shower after dressing removed. No active sexual stimulation x 2 weeks, otherwise, no restrictions.   2 - Call MD or go to ER for fever >102, severe pain / nausea / vomiting not relieved by medications, or acute change in medical status    Post Anesthesia Home Care Instructions  Activity: Get plenty of rest for the remainder of the day. A responsible individual must stay with you for 24 hours following the procedure.  For the next 24 hours, DO NOT: -Drive a car -Paediatric nurse -Drink alcoholic beverages -Take any medication unless instructed by your physician -Make any legal decisions or sign important papers.  Meals: Start with liquid foods such as gelatin or soup. Progress to regular foods as tolerated. Avoid greasy, spicy, heavy foods. If nausea and/or vomiting occur, drink only clear liquids until the nausea and/or vomiting subsides. Call your physician if vomiting continues.  Special Instructions/Symptoms: Your throat may feel dry or sore from the anesthesia or the breathing tube placed in your throat during surgery. If this causes discomfort, gargle with warm salt water. The discomfort should disappear within 24 hours.  Do not take any Tylenol until after 4:00 pm today.   Circumcision-Home Care Instructions  The following instructions have been prepared to help you care for yourself upon your return home today.   Wound Care & Hygiene:   You may apply ice to the penis.  This may help to decrease swelling.  Remove the dressing tomorrow.  If the dressing falls off before then, leave it off.  You may shower or bathe in 48 hours  Gently wash the penis with soap and water.  The stitches do not need to be removed.  Activity:  Do not drive or operate any equipment today.  The effects of anesthesia are still present, drowsiness may result.  Rest today, not necessarily flat bed rest, just take it easy.  You may resume your normal  activity in one to two days or as indicated by your physician.  Sexual Activity:  Erection and sexual relations should be avoided for *2 weeks.  Return to Work:  One to two days or as indicated by your physician .  Diet:  Drink liquids or eat a very light diet this evening.  You may resume a regular diet tomorrow.  General Expectations of your surgery:  You may have a small amount of bleeding The penis will be swollen and bruised for approximately one week You may wake during the night with an erection, usually this is caused by having a full bladder so you should try to urinate (pass your water) to relieve the erection or apply ice to the penis  Unexpected Observations - Call your doctor if these occur! Persistent or heavy bleeding Temperature of 101 degrees or more Severe pain not relieved by medication

## 2021-01-09 NOTE — Brief Op Note (Signed)
01/09/2021  3:22 PM  PATIENT:  Jesse Cardenas  65 y.o. male  PRE-OPERATIVE DIAGNOSIS:  PHIMOSIS  POST-OPERATIVE DIAGNOSIS:  PHIMOSIS  PROCEDURE:  Procedure(s): CIRCUMCISION ADULT/ PENILE BLOCK (N/A)  SURGEON:  Surgeon(s) and Role:    * Alexis Frock, MD - Primary  PHYSICIAN ASSISTANT:   ASSISTANTS: none   ANESTHESIA:   local and general  EBL:  20 mL   BLOOD ADMINISTERED:none  DRAINS: none   LOCAL MEDICATIONS USED:  MARCAINE    and LIDOCAINE   SPECIMEN:  Source of Specimen:  phimotic, redundant foreskin  DISPOSITION OF SPECIMEN:   discard  COUNTS:  YES  TOURNIQUET:  * No tourniquets in log *  DICTATION: .Other Dictation: Dictation Number [id number]  PLAN OF CARE: Discharge to home after PACU  PATIENT DISPOSITION:  PACU - hemodynamically stable.   Delay start of Pharmacological VTE agent (>24hrs) due to surgical blood loss or risk of bleeding: yes

## 2021-01-09 NOTE — Anesthesia Postprocedure Evaluation (Signed)
Anesthesia Post Note  Patient: Jesse Cardenas  Procedure(s) Performed: CIRCUMCISION ADULT/ PENILE BLOCK (Penis)     Patient location during evaluation: PACU Anesthesia Type: General Level of consciousness: awake and alert Pain management: pain level controlled Vital Signs Assessment: post-procedure vital signs reviewed and stable Respiratory status: spontaneous breathing, nonlabored ventilation and respiratory function stable Cardiovascular status: blood pressure returned to baseline and stable Postop Assessment: no apparent nausea or vomiting Anesthetic complications: no   No notable events documented.  Last Vitals:  Vitals:   01/09/21 1543 01/09/21 1545  BP: 127/82 127/82  Pulse: 73 74  Resp: 11 17  Temp:    SpO2: 95% 95%    Last Pain:  Vitals:   01/09/21 1325  TempSrc: Oral                 Lidia Collum

## 2021-01-09 NOTE — H&P (Signed)
Jesse Cardenas is an 65 y.o. male.    Chief Complaint: Pre-Op Circumcision  HPI:   1 - Moderate Risk Prostate Caner - s/p primary brachytherapy 03/2019 for Grade 2 cancer on eval PSA 4.77. TRUS 35 mL, no median.   Post-Brachytherapy Course:  05/2019 - PSA 3.46; 11/2019 PSA 1.71  05/2020 - PSA 1.16; 11/2020 PSA 0.59   2 - Phimosis - progressive bother from phimotic ring. UNcircumcised. Few episodes of balanitis, but not freqeunt. Interestd in circumcision.   PMH sig for obesity, DM2 (A1c 8-10). No prior surgeries. No ischemic CV disease / blood thinners. He is Clinical cytogeneticist for [employer]. His PCP is Lorelee Market MD with [facility]   Today " [name]" is seen to proceed with elective circumcision. NO iterval fevers.    Past Medical History:  Diagnosis Date   ED (erectile dysfunction)    GERD (gastroesophageal reflux disease)    Hypertension    followed by pcp---  (03-11-2019 per pt has had stress test approx. 2005, done at [facility], told ok )   Mild asthma    followed by pcp   Nocturia    Phimosis    Phimosis    Prostate cancer [facility]) urologist-- dr Tresa Moore;  oncologist--- dr Tammi Klippel   dx 11-09-2018,  Stage T1c,  Gles[place]on 3+4   Seasonal allergic rhinitis    Type 2 diabetes mellitus ([place])    followed by pcp----  (03-11-2019  checks blood sugar 3-4 times daily,  fasting sugar-- 120-180)    Past Surgical History:  Procedure Laterality Date   PROSTATE BIOPSY     RADIOACTIVE SEED IMPLANT N/A 03/18/2019   Procedure: RADIOACTIVE SEED IMPLANT/BRACHYTHERAPY IMPLANT;  Surgeon: Alexis Frock, MD;  Location: [place];  Service: Urology;  Laterality: N/A;   SPACE OAR INSTILLATION N/A 03/18/2019   Procedure: SPACE OAR INSTILLATION;  Surgeon: Alexis Frock, MD;  Location: [facility]  Service: Urology;  Laterality: N/A;    Family History  Problem Relation Age of Onset   Prostate cancer Neg Hx    Breast cancer Neg Hx    Colon cancer Neg Hx    Pancreatic  cancer Neg Hx    Social History:  reports that he quit smoking about 33 years ago. His smoking use included cigarettes. He has a 5.00 pack-year smoking history. He has never used smokeless tobacco. He reports current alcohol use. He reports that he does not use drugs.  Allergies:  Allergies  Allergen Reactions   Erythromycin Hives    No medications prior to admission.    No results found for this or any previous visit (from the past 48 hour(s)). No results found.  Review of Systems  Constitutional: Negative.  Negative for chills and fever.   Height 6\' 3"  (1.905 m), weight 116.6 kg. Physical Exam Vitals reviewed.  HENT:     Head: Normocephalic.     Nose: Nose normal.  Eyes:     Pupils: Pupils are equal, round, and reactive to light.  Cardiovascular:     Rate and Rhythm: Normal rate.  Abdominal:     General: Abdomen is flat.  Genitourinary:    Comments: Stable moderate phimosis w/o active balanitis Musculoskeletal:     Cervical back: Normal range of motion.  Skin:    General: Skin is warm.  Neurological:     General: No focal deficit present.     Mental Status: He is alert.  Psychiatric:        Mood and Affect:  Mood normal.     Assessment/Plan  Proceed as planned with elective circumcision. Risks, benefits, alternatives, expected peri-op course discussed previously and reiterated today.   Alexis Frock, MD 01/09/2021, 8:33 AM

## 2021-01-10 ENCOUNTER — Encounter (HOSPITAL_BASED_OUTPATIENT_CLINIC_OR_DEPARTMENT_OTHER): Payer: Self-pay | Admitting: Urology

## 2021-01-10 NOTE — Op Note (Signed)
NAME: Jesse Cardenas, Jesse Cardenas MEDICAL RECORD NO: [id number] ACCOUNT NO: [id number] DATE OF BIRTH: 06-03-1955 FACILITY: [facility] LOCATION: [facility] PHYSICIAN: Alexis Frock, MD  Operative Report   DATE OF PROCEDURE: 01/09/2021  PREOPERATIVE DIAGNOSIS:  Phimosis.  PROCEDURE:  Circumcision and penile block.  ESTIMATED BLOOD LOSS:  Nil.  COMPLICATIONS:  None.  SPECIMEN: Phimotic foreskin for discard.  FINDINGS: 1.  Moderate phimosis pre-circumcision. 2.  Complete resolution of phimosis post-circumcision.  PENILE BLOCK:  10 mL of 1% lidocaine and 10 mL of 0.5% Marcaine in a ring block plus dorsal penile nerve orientation.  INDICATIONS:  The patient is a very pleasant 65 year old man with recent history of prostate cancer, status post radiation therapy.  He has done quite well from this.  He has also had problems with phimosis with occasional recurrent balanitis and he is  seeking definitive management of this with circumcision.  He presents for this today.  He has no evidence of active infection.  Informed consent was obtained and placed in the medical record.  PROCEDURE IN DETAIL:  The patient was verified, procedure being circumcision, penile block was confirmed.  Procedure timeout was performed.  Intravenous antibiotics administered. General anesthesia induced.  The patient was placed in the supine position.   Sterile field was created, prepped and draped the patient's penis, perineum, proximal thigh using iodine.  Notably, his foreskin was just retractile, so the inner and outer foreskin leaflets were prepped at this point. The 12 o'clock position of the  penile skin was marked and a circumferential proximal collar was made at the corresponding area of the unstretched corona of the glans and a distal collar was made circumferentially, positioned approximately 7 mm proximal to the corona of the glans.   Four hemostats applied in 12 o'clock position and the redundant preputial collar was  released from underlying subcutaneous tissue using cautery dissection.  The redundant phimotic foreskin was then set aside for discard. The 12 o'clock position was  reapproximated with a single stitch as was the 6 o'clock position.  The frenulum was trimmed for cosmesis and reapproximated using U stitch x2 and the remaining tissue was reapproximated using two separate running suture lines of 3-0 Vicryl.  This  resulted in excellent tissue apposition, excellent cosmesis and excellent hemostasis.  A dressing of Xeroform, followed by Doreene Nest and Coban was applied after a penile block was performed.  Penile block was performed by instilling approximately 10 mL of a 50:50 Marcaine, lidocaine slurry along the presumed course of the penile nerve just inferior to the pubic ramus, with additional 10 mL of the slurry placed in a ring block type fashion at  the base of the penis.  Procedure was then terminated.  The patient tolerated the procedure well.  No immediate perioperative complications.  The patient taken to postanesthesia care unit in stable condition.  Plan for discharge home.   SHW D: 01/09/2021 3:26:32 pm T: 01/09/2021 11:25:00 pm  JOB: [id number]/ [id number]

## 2021-04-28 IMAGING — DX DG CHEST 2V
2 series · 2 of 2 positions shown · non-contrast
Comparison: None.

CLINICAL DATA: Preoperative evaluation.

EXAM:
CHEST - 2 VIEW

[chest pa]
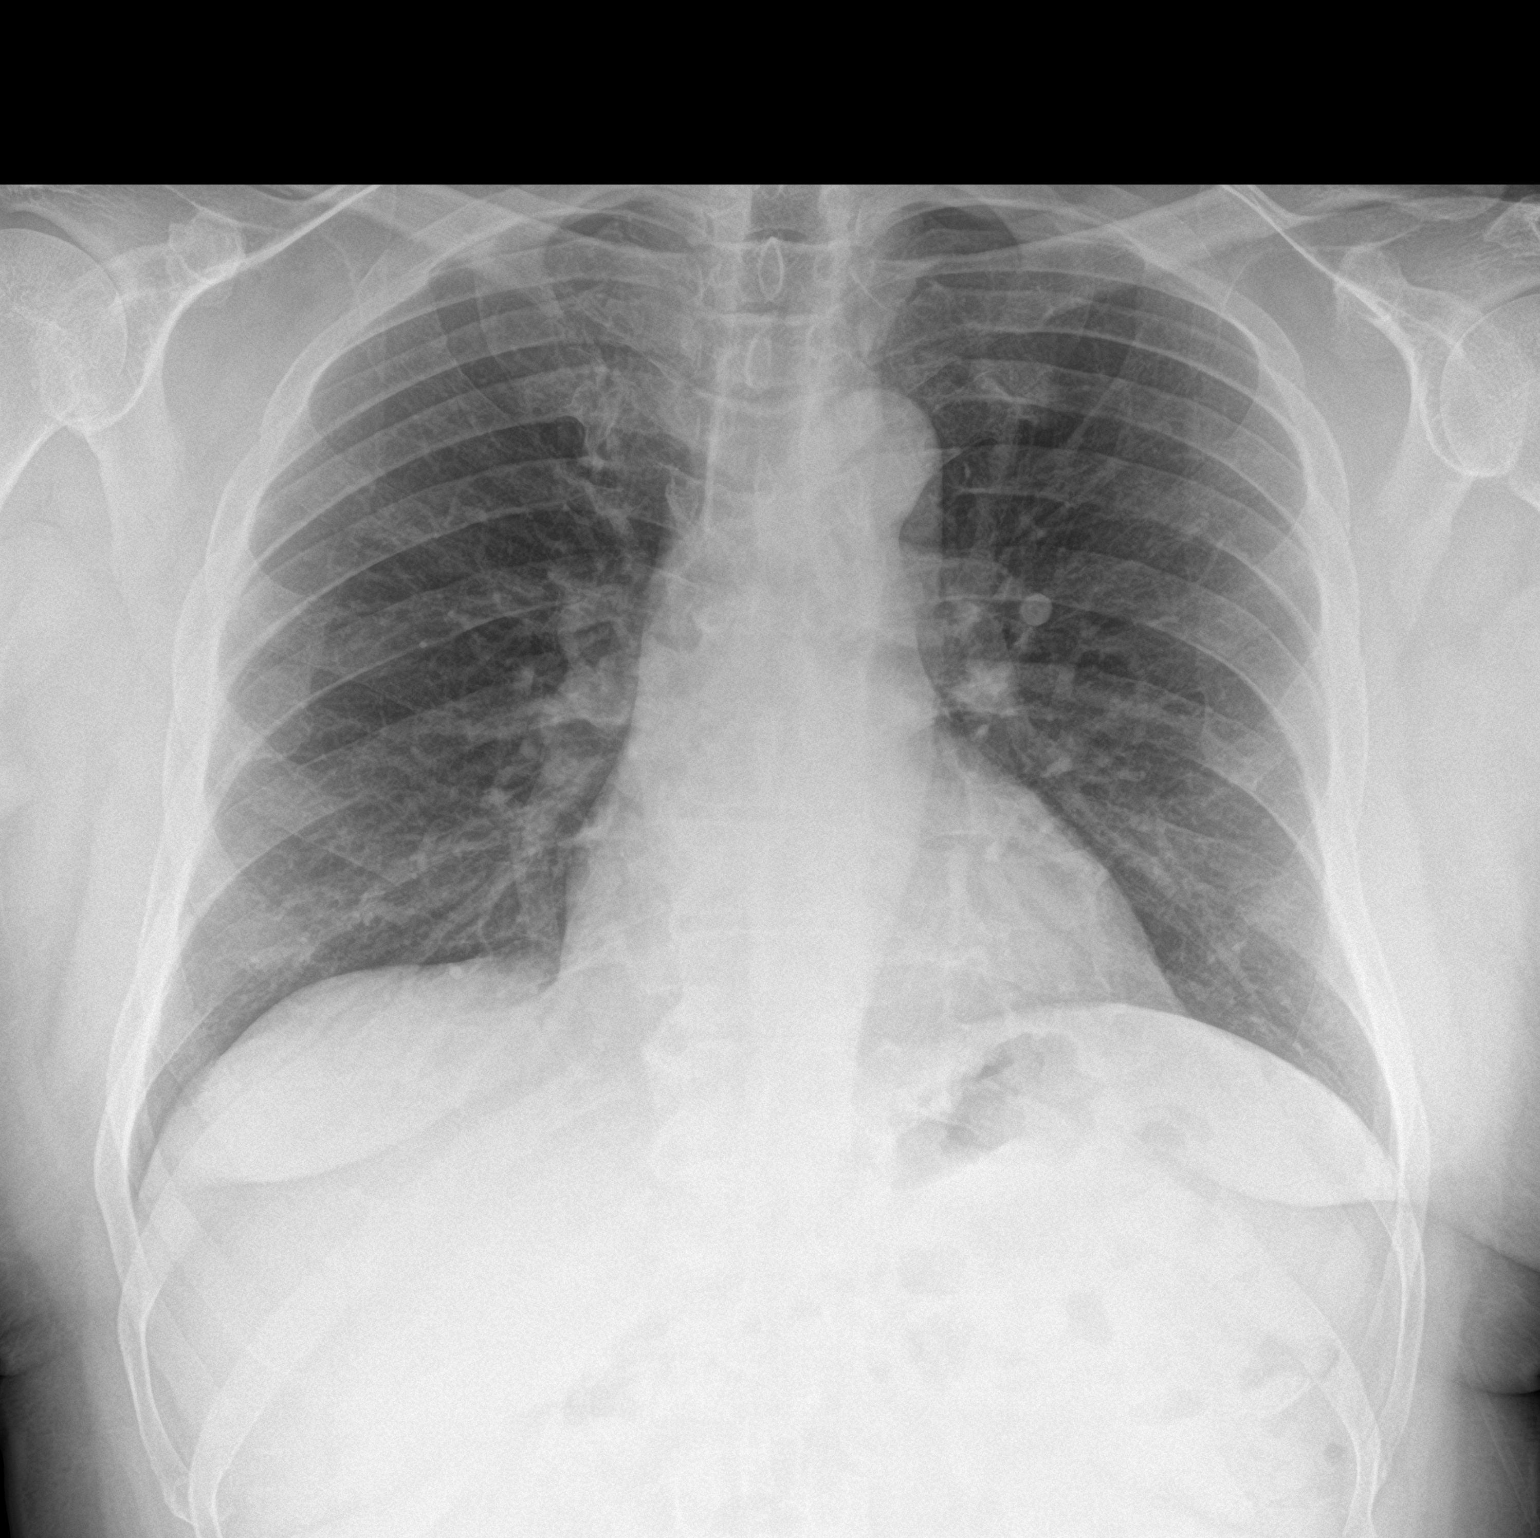

[chest lat]
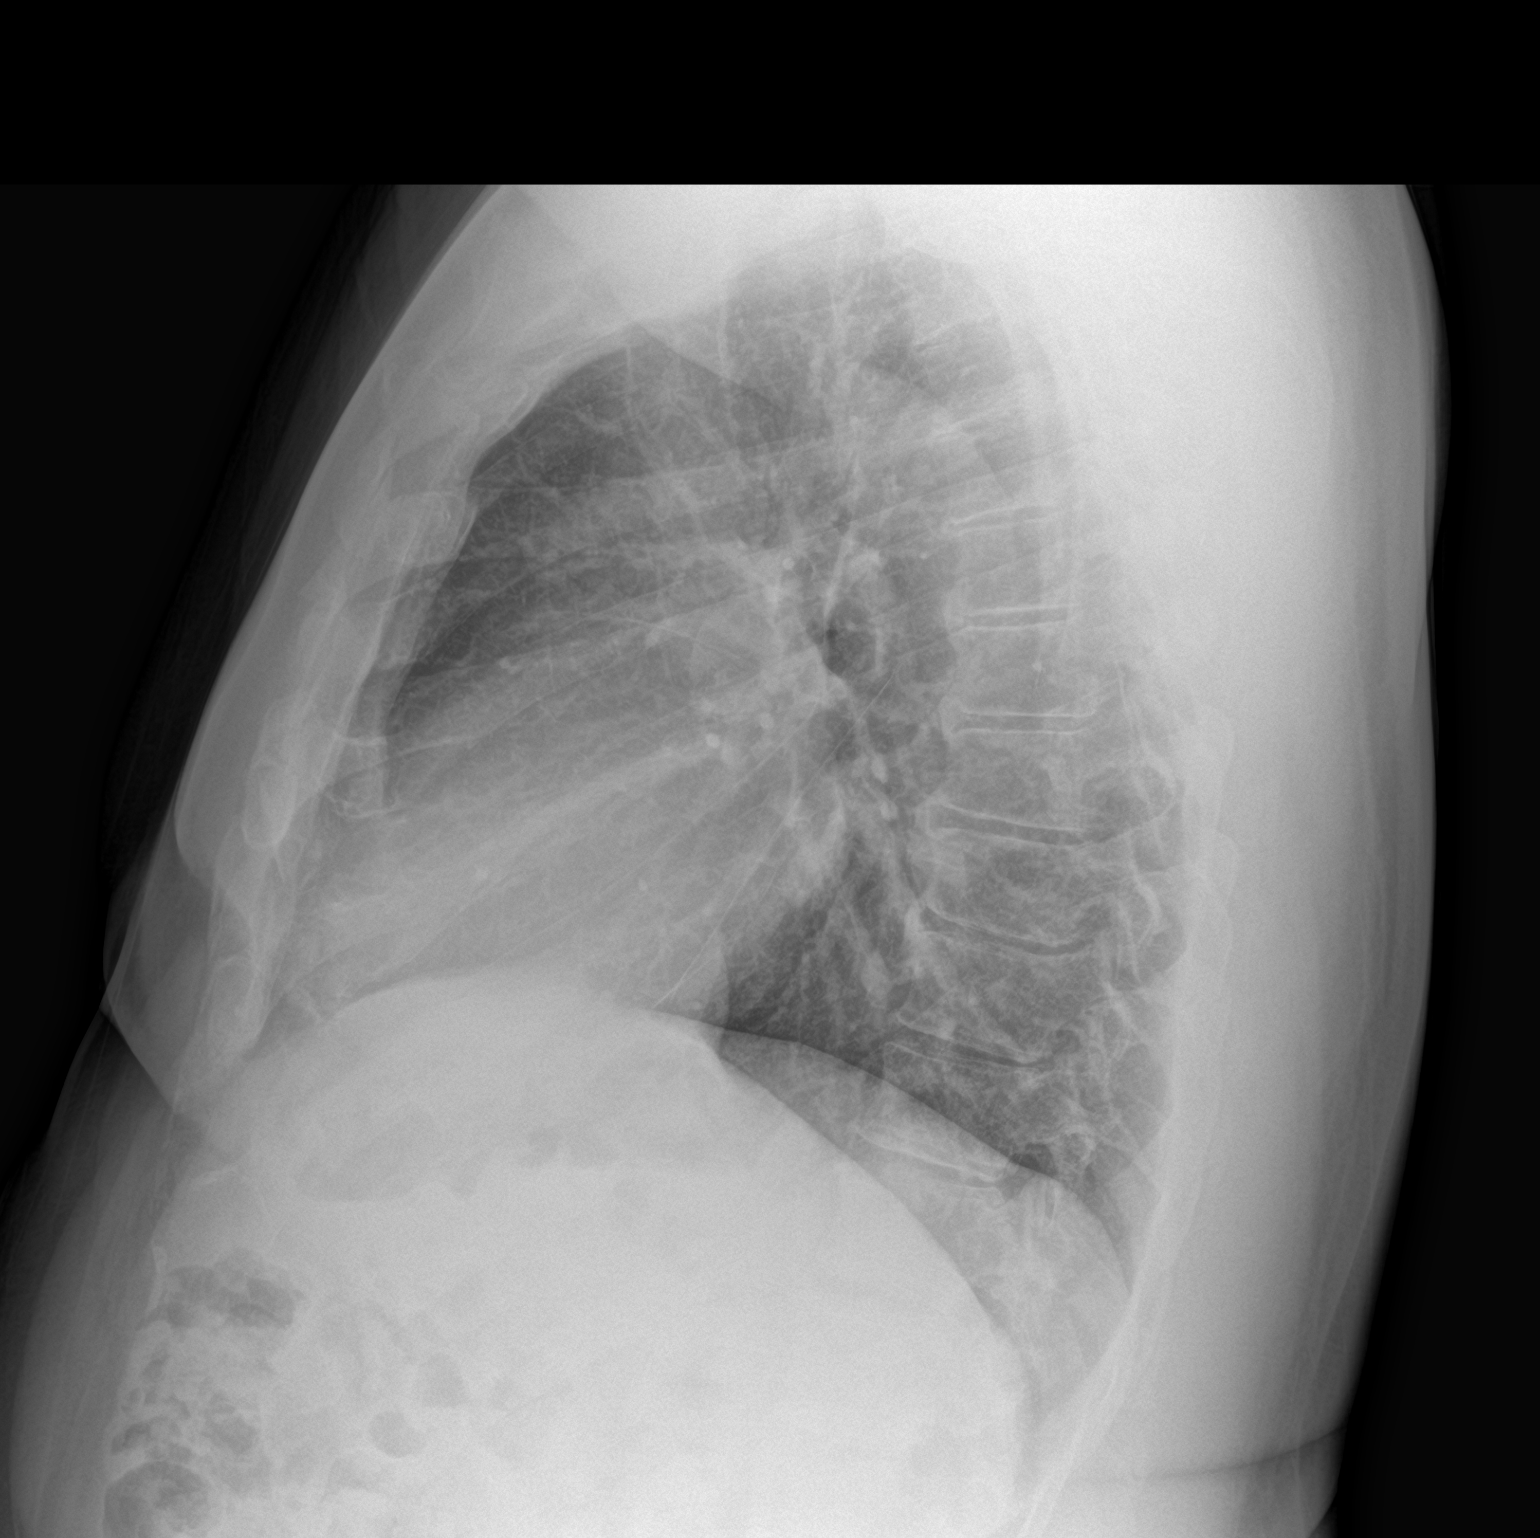

[2 of 2 positions shown; findings below may reference images not displayed]

FINDINGS: There is no evidence of acute infiltrate, pleural effusion or
pneumothorax. The heart size and mediastinal contours are within
normal limits. The visualized skeletal structures are unremarkable.
IMPRESSION: No active cardiopulmonary disease.
